# Patient Record
Sex: Male | Born: 1980 | Race: White | Hispanic: No | Marital: Married | Smoking: Never smoker
Health system: Southern US, Community
[De-identification: ages and names within clinical notes are randomized; demographics above are authoritative.]

## PROBLEM LIST (undated history)

## (undated) DIAGNOSIS — Z87442 Personal history of urinary calculi: Secondary | ICD-10-CM

## (undated) DIAGNOSIS — N471 Phimosis: Secondary | ICD-10-CM

## (undated) DIAGNOSIS — Z973 Presence of spectacles and contact lenses: Secondary | ICD-10-CM

## (undated) HISTORY — PX: WISDOM TOOTH EXTRACTION: SHX21

---

## 2016-06-28 ENCOUNTER — Emergency Department: Payer: Managed Care, Other (non HMO)

## 2016-06-28 ENCOUNTER — Emergency Department
Admission: EM | Admit: 2016-06-28 | Discharge: 2016-06-28 | Disposition: A | Discharge status: Home / Self Care | Payer: Managed Care, Other (non HMO) | Attending: Emergency Medicine | Admitting: Emergency Medicine

## 2016-06-28 DIAGNOSIS — R109 Unspecified abdominal pain: Secondary | ICD-10-CM | POA: Diagnosis present

## 2016-06-28 DIAGNOSIS — N200 Calculus of kidney: Secondary | ICD-10-CM | POA: Insufficient documentation

## 2016-06-28 LAB — CBC WITH DIFFERENTIAL/PLATELET
BASOS ABS: 0.1 10*3/uL (ref 0–0.1)
Basophils Relative: 1 %
EOS PCT: 2 %
Eosinophils Absolute: 0.2 10*3/uL (ref 0–0.7)
HCT: 43.7 % (ref 40.0–52.0)
Hemoglobin: 14.9 g/dL (ref 13.0–18.0)
LYMPHS ABS: 3.1 10*3/uL (ref 1.0–3.6)
LYMPHS PCT: 38 %
MCH: 30.9 pg (ref 26.0–34.0)
MCHC: 34.2 g/dL (ref 32.0–36.0)
MCV: 90.3 fL (ref 80.0–100.0)
Monocytes Absolute: 0.8 10*3/uL (ref 0.2–1.0)
Monocytes Relative: 10 %
NEUTROS PCT: 49 %
Neutro Abs: 4 10*3/uL (ref 1.4–6.5)
PLATELETS: 234 10*3/uL (ref 150–440)
RBC: 4.83 MIL/uL (ref 4.40–5.90)
RDW: 12.7 % (ref 11.5–14.5)
WBC: 8.2 10*3/uL (ref 3.8–10.6)

## 2016-06-28 LAB — COMPREHENSIVE METABOLIC PANEL
ALT: 24 U/L (ref 17–63)
AST: 23 U/L (ref 15–41)
Albumin: 4.5 g/dL (ref 3.5–5.0)
Alkaline Phosphatase: 83 U/L (ref 38–126)
Anion gap: 6 (ref 5–15)
BUN: 15 mg/dL (ref 6–20)
CHLORIDE: 108 mmol/L (ref 101–111)
CO2: 26 mmol/L (ref 22–32)
Calcium: 9.1 mg/dL (ref 8.9–10.3)
Creatinine, Ser: 1.4 mg/dL — ABNORMAL HIGH (ref 0.61–1.24)
GFR calc Af Amer: >60 mL/min (ref >60)
Glucose, Bld: 101 mg/dL — ABNORMAL HIGH (ref 65–99)
POTASSIUM: 3.5 mmol/L (ref 3.5–5.1)
Sodium: 140 mmol/L (ref 135–145)
Total Bilirubin: 1 mg/dL (ref 0.3–1.2)
Total Protein: 7.4 g/dL (ref 6.5–8.1)

## 2016-06-28 LAB — URINALYSIS, COMPLETE (UACMP) WITH MICROSCOPIC
BILIRUBIN URINE: NEGATIVE
Bacteria, UA: NONE SEEN
GLUCOSE, UA: NEGATIVE mg/dL
Hgb urine dipstick: NEGATIVE
KETONES UR: NEGATIVE mg/dL
Leukocytes, UA: NEGATIVE
NITRITE: NEGATIVE
PH: 5 (ref 5.0–8.0)
Protein, ur: NEGATIVE mg/dL
RBC / HPF: NONE SEEN RBC/hpf (ref 0–5)
Specific Gravity, Urine: 1.024 (ref 1.005–1.030)
Squamous Epithelial / LPF: NONE SEEN

## 2016-06-28 LAB — LIPASE, BLOOD: LIPASE: 24 U/L (ref 11–51)

## 2016-06-28 MED ORDER — TAMSULOSIN HCL 0.4 MG PO CAPS: 0.4000 mg | ORAL_CAPSULE | Freq: Every day | ORAL | 0 refills | Status: DC

## 2016-06-28 MED ORDER — OXYCODONE-ACETAMINOPHEN 5-325 MG PO TABS: 1.0000 | ORAL_TABLET | ORAL | 0 refills | Status: DC | PRN

## 2016-06-28 MED ORDER — SODIUM CHLORIDE 0.9 % IV BOLUS (SEPSIS)
1000.0000 mL | Freq: Once | INTRAVENOUS | Status: AC
  Administered 2016-06-28: 1000 mL via INTRAVENOUS

## 2016-06-28 MED ORDER — ONDANSETRON HCL 4 MG/2ML IJ SOLN
4.0000 mg | Freq: Once | INTRAMUSCULAR | Status: AC
  Administered 2016-06-28: 4 mg via INTRAVENOUS
  Filled 2016-06-28: qty 2

## 2016-06-28 MED ORDER — ONDANSETRON 4 MG PO TBDP: 4.0000 mg | ORAL_TABLET | Freq: Three times a day (TID) | ORAL | 0 refills | Status: DC | PRN

## 2016-06-28 MED ORDER — MORPHINE SULFATE (PF) 4 MG/ML IV SOLN
4.0000 mg | Freq: Once | INTRAVENOUS | Status: AC
  Administered 2016-06-28: 4 mg via INTRAVENOUS
  Filled 2016-06-28: qty 1

## 2016-06-28 MED ORDER — MORPHINE SULFATE (PF) 4 MG/ML IV SOLN
INTRAVENOUS | Status: AC
  Administered 2016-06-28: 4 mg
  Filled 2016-06-28: qty 1

## 2016-06-28 NOTE — ED Provider Notes (Signed)
[facility] Emergency Department Provider Note    First MD Initiated Contact with Patient 06/28/16 [phone]     (approximate)  I have reviewed the triage vital signs and the nursing notes.   HISTORY  Chief Complaint Flank Pain   HPI Shaun Holder is a 36 y.o. male presents emergency department acute onset of right flank pain is currently 8 out of 10 but at maximum intensity was 10 out of 10. Patient states symptoms will come from sleep this morning. Patient denies any fever. Patient does admit to nausea no vomiting. Patient does admit to previous history of kidney stones is consistent with this current episode. Patient denies any dyspnea or hematuria   Past medical history Kidney stones There are no active problems to display for this patient.   Past surgical history None  Prior to Admission medications   Medication Sig Start Date End Date Taking? Authorizing Provider  ondansetron (ZOFRAN ODT) 4 MG disintegrating tablet Take 1 tablet (4 mg total) by mouth every 8 (eight) hours as needed for nausea or vomiting. 06/28/16   Darci Current, MD  oxyCODONE-acetaminophen (ROXICET) 5-325 MG tablet Take 1 tablet by mouth every 4 (four) hours as needed for severe pain. 06/28/16   Darci Current, MD  tamsulosin (FLOMAX) 0.4 MG CAPS capsule Take 1 capsule (0.4 mg total) by mouth daily after breakfast. 06/28/16   Darci Current, MD    Allergies No known drug allergies No family history on file.  Social History Social History  Substance Use Topics  . Smoking status: Not on file  . Smokeless tobacco: Not on file  . Alcohol use Not on file    Review of Systems Constitutional: No fever/chills Eyes: No visual changes. ENT: No sore throat. Cardiovascular: Denies chest pain. Respiratory: Denies shortness of breath. Gastrointestinal: Positive for right flank pain  positive for nausea, no vomiting.  No diarrhea.  No constipation. Genitourinary: Negative for  dysuria. Musculoskeletal: Negative for back pain. Skin: Negative for rash. Neurological: Negative for headaches, focal weakness or numbness.  10-point ROS otherwise negative.  ____________________________________________   PHYSICAL EXAM:  VITAL SIGNS: ED Triage Vitals  Enc Vitals Group     BP 06/28/16 0435 (!) 150/91     Pulse Rate 06/28/16 0435 66     Resp 06/28/16 0435 18     Temp 06/28/16 0435 98.8 F (37.1 C)     Temp Source 06/28/16 0435 Oral     SpO2 06/28/16 0435 97 %     Weight 06/28/16 0436 223 lb (101.2 kg)     Height 06/28/16 0436  (1.778 m)     Head Circumference --      Peak Flow --      Pain Score 06/28/16 0435 8     Pain Loc --      Pain Edu? --      Excl. in GC? --     Constitutional: Alert and oriented. Apparent discomfort Eyes: Conjunctivae are normal. PERRL. EOMI. Head: Atraumatic. Mouth/Throat: Mucous membranes are moist. Oropharynx non-erythematous. Neck: No stridor.   Cardiovascular: Normal rate, regular rhythm. Good peripheral circulation. Grossly normal heart sounds. Respiratory: Normal respiratory effort.  No retractions. Lungs CTAB. Gastrointestinal: Soft and nontender. No distention.  Musculoskeletal: No lower extremity tenderness nor edema. No gross deformities of extremities. Neurologic:  Normal speech and language. No gross focal neurologic deficits are appreciated.  Skin:  Skin is warm, dry and intact. No rash noted.   ____________________________________________  LABS (all labs ordered are listed, but only abnormal results are displayed)  Labs Reviewed  COMPREHENSIVE METABOLIC PANEL - Abnormal; Notable for the following:       Result Value   Glucose, Bld 101 (*)    Creatinine, Ser 1.40 (*)    All other components within normal limits  URINALYSIS, COMPLETE (UACMP) WITH MICROSCOPIC - Abnormal; Notable for the following:    Color, Urine YELLOW (*)    APPearance CLEAR (*)    All other components within normal limits  CBC  WITH DIFFERENTIAL/PLATELET  LIPASE, BLOOD    RADIOLOGY I, [place] N Jameson Tormey, personally viewed and evaluated these images (plain radiographs) as part of my medical decision making, as well as reviewing the written report by the radiologist.  Ct Renal Stone Study  Result Date: 06/28/2016 CLINICAL DATA:  Awoke with flank pain. EXAM: CT ABDOMEN AND PELVIS WITHOUT CONTRAST TECHNIQUE: Multidetector CT imaging of the abdomen and pelvis was performed following the standard protocol without IV contrast. COMPARISON:  None. FINDINGS: Lower chest: Dependent atelectasis at the right lung base. No pleural fluid. Hepatobiliary: No focal liver abnormality is seen. No gallstones, gallbladder wall thickening, or biliary dilatation. Pancreas: No ductal dilatation or inflammation. Spleen: Normal in size without focal abnormality. Adrenals/Urinary Tract: No adrenal nodule. Obstructing 3 mm stone in the distal right ureter just proximal to the ureterovesicular junction with moderate proximal hydroureteronephrosis. Mild right perinephric edema. There are at least 5 nonobstructing stones in the right kidney. No left hydronephrosis. There are least 4 nonobstructing stones in the left kidney. Urinary bladder is near completely decompressed without stone. Stomach/Bowel: Stomach is within normal limits. Appendix appears normal. No evidence of bowel wall thickening, distention, or inflammatory changes. Vascular/Lymphatic: No significant vascular findings are present. No enlarged abdominal or pelvic lymph nodes. Reproductive: Prostate is unremarkable. Other: No free air, free fluid, or intra-abdominal fluid collection. Tiny fat containing umbilical hernia. Musculoskeletal: There are no acute or suspicious osseous abnormalities. IMPRESSION: 1. Obstructing 3 mm stone in the distal right ureter just proximal to the ureterovesicular junction with moderate hydroureteronephrosis. 2. Multiple bilateral nonobstructing renal calculi.  Electronically Signed   By: Rubye Oaks M.D.   On: 06/28/2016 05:55      Procedures   ____________________________________________   INITIAL IMPRESSION / ASSESSMENT AND PLAN / ED COURSE  Pertinent labs & imaging results that were available during my care of the patient were reviewed by me and considered in my medical decision making (see chart for details).  She given IV morphine and Zofran analgesia and antiemetic respectively with resolution of both. CT scan revealed right ureterolithiasis. Patient prescribe Percocet Flomax and Zofran for home. Patient pain at this time is 0      ____________________________________________  FINAL CLINICAL IMPRESSION(S) / ED DIAGNOSES  Final diagnoses:  Kidney stone     MEDICATIONS GIVEN DURING THIS VISIT:  Medications  morphine 4 MG/ML injection 4 mg (4 mg Intravenous Given 06/28/16 0457)  ondansetron (ZOFRAN) injection 4 mg (4 mg Intravenous Given 06/28/16 0456)  sodium chloride 0.9 % bolus 1,000 mL (0 mLs Intravenous Stopped 06/28/16 0542)  morphine 4 MG/ML injection (4 mg  Given 06/28/16 0545)     NEW OUTPATIENT MEDICATIONS STARTED DURING THIS VISIT:  Discharge Medication List as of 06/28/2016  6:45 AM    START taking these medications   Details  ondansetron (ZOFRAN ODT) 4 MG disintegrating tablet Take 1 tablet (4 mg total) by mouth every 8 (eight) hours as needed for nausea or vomiting., Starting  Fri 06/28/2016, Print    oxyCODONE-acetaminophen (ROXICET) 5-325 MG tablet Take 1 tablet by mouth every 4 (four) hours as needed for severe pain., Starting Fri 06/28/2016, Print    tamsulosin (FLOMAX) 0.4 MG CAPS capsule Take 1 capsule (0.4 mg total) by mouth daily after breakfast., Starting Fri 06/28/2016, Print        Discharge Medication List as of 06/28/2016  6:45 AM      Discharge Medication List as of 06/28/2016  6:45 AM       Note:  This document was prepared using Dragon voice recognition software and may include  unintentional dictation errors.    Darci Current, MD 06/28/16 [phone]

## 2016-06-28 NOTE — ED Triage Notes (Signed)
Pt woke up tonight with left flank pain, hx of kidney stones states feels the same.

## 2016-06-28 NOTE — ED Notes (Signed)
ED Provider at bedside. 

## 2016-06-29 ENCOUNTER — Emergency Department
Admission: EM | Admit: 2016-06-29 | Discharge: 2016-06-29 | Disposition: A | Discharge status: Home / Self Care | Payer: Managed Care, Other (non HMO) | Attending: Emergency Medicine | Admitting: Emergency Medicine

## 2016-06-29 ENCOUNTER — Encounter: Payer: Self-pay | Admitting: Emergency Medicine

## 2016-06-29 DIAGNOSIS — Z79899 Other long term (current) drug therapy: Secondary | ICD-10-CM | POA: Insufficient documentation

## 2016-06-29 DIAGNOSIS — N201 Calculus of ureter: Secondary | ICD-10-CM | POA: Insufficient documentation

## 2016-06-29 DIAGNOSIS — R109 Unspecified abdominal pain: Secondary | ICD-10-CM | POA: Diagnosis present

## 2016-06-29 LAB — URINALYSIS, COMPLETE (UACMP) WITH MICROSCOPIC
BACTERIA UA: NONE SEEN
Bilirubin Urine: NEGATIVE
GLUCOSE, UA: NEGATIVE mg/dL
HGB URINE DIPSTICK: NEGATIVE
Ketones, ur: NEGATIVE mg/dL
Leukocytes, UA: NEGATIVE
NITRITE: NEGATIVE
PH: 5 (ref 5.0–8.0)
Protein, ur: NEGATIVE mg/dL
SPECIFIC GRAVITY, URINE: 1.028 (ref 1.005–1.030)
Squamous Epithelial / LPF: NONE SEEN

## 2016-06-29 MED ORDER — KETOROLAC TROMETHAMINE 30 MG/ML IJ SOLN
15.0000 mg | Freq: Once | INTRAMUSCULAR | Status: AC
  Administered 2016-06-29: 15 mg via INTRAVENOUS
  Filled 2016-06-29: qty 1

## 2016-06-29 MED ORDER — SODIUM CHLORIDE 0.9 % IV BOLUS (SEPSIS)
1000.0000 mL | Freq: Once | INTRAVENOUS | Status: AC
  Administered 2016-06-29: 1000 mL via INTRAVENOUS

## 2016-06-29 MED ORDER — TAMSULOSIN HCL 0.4 MG PO CAPS
0.4000 mg | ORAL_CAPSULE | Freq: Once | ORAL | Status: AC
  Administered 2016-06-29: 0.4 mg via ORAL
  Filled 2016-06-29: qty 1

## 2016-06-29 MED ORDER — IBUPROFEN 800 MG PO TABS: 800.0000 mg | ORAL_TABLET | Freq: Three times a day (TID) | ORAL | 0 refills | Status: DC | PRN

## 2016-06-29 MED ORDER — MORPHINE SULFATE (PF) 4 MG/ML IV SOLN
4.0000 mg | Freq: Once | INTRAVENOUS | Status: AC
  Administered 2016-06-29: 4 mg via INTRAVENOUS
  Filled 2016-06-29: qty 1

## 2016-06-29 NOTE — ED Triage Notes (Signed)
Pt reports seen here yesterday and diagnosed with kidney stone. Pt reports pain today is worse.

## 2016-06-29 NOTE — Discharge Instructions (Signed)
You have been seen in the Emergency Department (ED)  Today and was diagnosed with kidney stones. While the stone is traveling through the ureter, which is the tube that carries urine from the kidney to the bladder, you will probably feel pain. The pain may be mild or very severe. You may also have some blood in your urine. As soon as the stone reaches the bladder, any intense pain should go away. If a stone is too large to pass on its own, you may need a medical procedure to help you pass the stone.  ° °As we have discussed, please drink plenty of fluids and use a urinary strainer to attempt to capture the stone.  Please make a follow up appointment with Urology in the next week by calling the number below and bring the stone with you. ° °Take ibuprofen 800mg every 6 hours for the pain. If the pain is not well controlled with ibuprofen you may take one percocet every 4 hours. Do not take tylenol while taking percocet. Please also take your prescribed flomax daily. Check with your doctor if you have a history of gastritis, stomach ulcers, renal failure or impaired kidney function as you may not be able to take ibuprofen/ motrin. Your doctor can give you a different prescription for pain control. ° °Follow-up with your doctor or return to the ER in 12-24 hours if your pain is not well controlled, if you develop pain or burning with urination, or if you develop a fever. Otherwise follow up in 3-5 days with your doctor. ° °When should you call for help?  °Call your doctor now or seek immediate medical care if:  °You cannot keep down fluids.  °Your pain gets worse.  °You have a fever or chills.  °You have new or worse pain in your back just below your rib cage (the flank area).  °You have new or more blood in your urine. °You have pain or burning with urination °You are unable to urinate °You have abdominal pain ° °Watch closely for changes in your health, and be sure to contact your doctor if:  °You do not get better as  expected ° °How can you care for yourself at home?  °Drink plenty of fluids, enough so that your urine is light yellow or clear like water. If you have kidney, heart, or liver disease and have to limit fluids, talk with your doctor before you increase the amount of fluids you drink.  °Take pain medicines exactly as directed. Call your doctor if you think you are having a problem with your medicine.  °If the doctor gave you a prescription medicine for pain, take it as prescribed.  °If you are not taking a prescription pain medicine, ask your doctor if you can take an over-the-counter medicine. Read and follow all instructions on the label. °Your doctor may ask you to strain your urine so that you can collect your kidney stone when it passes. You can use a kitchen strainer or a tea strainer to catch the stone. Store it in a plastic bag until you see your doctor again. ° °Preventing future kidney stones  °Some changes in your diet may help prevent kidney stones. Depending on the cause of your stones, your doctor may recommend that you:  °Drink plenty of fluids, enough so that your urine is light yellow or clear like water. If you have kidney, heart, or liver disease and have to limit fluids, talk with your doctor before you increase   the amount of fluids you drink.  °Limit coffee, tea, and alcohol. Also avoid grapefruit juice.  °Do not take more than the recommended daily dose of vitamins C and D.  °Avoid antacids such as Gaviscon, Maalox, Mylanta, or Tums.  °Limit the amount of salt (sodium) in your diet.  °Eat a balanced diet that is not too high in protein.  °Limit foods that are high in a substance called oxalate, which can cause kidney stones. These foods include dark green vegetables, rhubarb, chocolate, wheat bran, nuts, cranberries, and beans. ° ° ° °

## 2016-06-29 NOTE — ED Provider Notes (Signed)
[facility] Emergency Department Provider Note  ____________________________________________  Time seen: Approximately 12:31 PM  I have reviewed the triage vital signs and the nursing notes.   HISTORY  Chief Complaint Flank Pain and Nephrolithiasis   HPI Shaun Holder is a 36 y.o. male a history of kidney stones recently diagnosed with one yesterdaywho presents for worsening pain. Patient endorses 9 out of 10 right flank pain radiating to his right groin, crampy, intermittent, associated with nausea. He took one Zofran and 1 Percocet this morning with no improvement of the pain. No fever or chills, no dysuria, no abdominal pain, no vomiting.  History reviewed. No pertinent past medical history.  There are no active problems to display for this patient.   No past surgical history on file.  Prior to Admission medications   Medication Sig Start Date End Date Taking? Authorizing Provider  ondansetron (ZOFRAN ODT) 4 MG disintegrating tablet Take 1 tablet (4 mg total) by mouth every 8 (eight) hours as needed for nausea or vomiting. 06/28/16  Yes Darci Current, MD  oxyCODONE-acetaminophen (ROXICET) 5-325 MG tablet Take 1 tablet by mouth every 4 (four) hours as needed for severe pain. 06/28/16  Yes Darci Current, MD  tamsulosin (FLOMAX) 0.4 MG CAPS capsule Take 1 capsule (0.4 mg total) by mouth daily after breakfast. 06/28/16  Yes Darci Current, MD  ibuprofen (ADVIL,MOTRIN) 800 MG tablet Take 1 tablet (800 mg total) by mouth every 8 (eight) hours as needed. 06/29/16   Nita Sickle, MD    Allergies Patient has no known allergies.  No family history on file.  Social History Social History  Substance Use Topics  . Smoking status: Not on file  . Smokeless tobacco: Not on file  . Alcohol use Not on file    Review of Systems  Constitutional: Negative for fever. Eyes: Negative for visual changes. ENT: Negative for sore throat. Neck: No neck  pain  Cardiovascular: Negative for chest pain. Respiratory: Negative for shortness of breath. Gastrointestinal: Negative for abdominal pain, vomiting or diarrhea. + nausea Genitourinary: Negative for dysuria. + R flank pain Musculoskeletal: Negative for back pain. Skin: Negative for rash. Neurological: Negative for headaches, weakness or numbness. Psych: No SI or HI  ____________________________________________   PHYSICAL EXAM:  VITAL SIGNS: ED Triage Vitals  Enc Vitals Group     BP 06/29/16 1215 137/89     Pulse Rate 06/29/16 1215 100     Resp 06/29/16 1215 20     Temp 06/29/16 1215 97.8 F (36.6 C)     Temp Source 06/29/16 1215 Oral     SpO2 06/29/16 1215 98 %     Weight 06/29/16 1216 223 lb (101.2 kg)     Height 06/29/16 1216  (1.778 m)     Head Circumference --      Peak Flow --      Pain Score 06/29/16 1215 9     Pain Loc --      Pain Edu? --      Excl. in GC? --     Constitutional: Alert and oriented. Well appearing and in no apparent distress. HEENT:      Head: Normocephalic and atraumatic.         Eyes: Conjunctivae are normal. Sclera is non-icteric. EOMI. PERRL      Mouth/Throat: Mucous membranes are moist.       Neck: Supple with no signs of meningismus. Cardiovascular: Regular rate and rhythm. No murmurs, gallops, or rubs.  2+ symmetrical distal pulses are present in all extremities. No JVD. Respiratory: Normal respiratory effort. Lungs are clear to auscultation bilaterally. No wheezes, crackles, or rhonchi.  Gastrointestinal: Soft, non tender, and non distended with positive bowel sounds. No rebound or guarding. Genitourinary: R CVA tenderness. Musculoskeletal: Nontender with normal range of motion in all extremities. No edema, cyanosis, or erythema of extremities. Neurologic: Normal speech and language. Face is symmetric. Moving all extremities. No gross focal neurologic deficits are appreciated. Skin: Skin is warm, dry and intact. No rash  noted. Psychiatric: Mood and affect are normal. Speech and behavior are normal.  ____________________________________________   LABS (all labs ordered are listed, but only abnormal results are displayed)  Labs Reviewed  URINALYSIS, COMPLETE (UACMP) WITH MICROSCOPIC - Abnormal; Notable for the following:       Result Value   Color, Urine YELLOW (*)    APPearance HAZY (*)    All other components within normal limits   ____________________________________________  EKG  none ____________________________________________  RADIOLOGY  none  ____________________________________________   PROCEDURES  Procedure(s) performed: None Procedures Critical Care performed:  None ____________________________________________   INITIAL IMPRESSION / ASSESSMENT AND PLAN / ED COURSE   36 y.o. male a history of kidney stones recently diagnosed with one yesterdaywho presents for worsening pain. Will check UA to rule out superimposed infection. Will treat with IV toradol, IV morphine, fluid and flomax.  Clinical Course as of Jun 29 1433  Sat Jun 29, 2016  1433 Patient reports pain is markedly improved at this time. UA with no evidence of urinary tract infection. We'll discharge home on standing ibuprofen, Percocet when necessary and Flomax.  [CV]    Clinical Course User Index [CV] Nita Sickle, MD    Pertinent labs & imaging results that were available during my care of the patient were reviewed by me and considered in my medical decision making (see chart for details).    ____________________________________________   FINAL CLINICAL IMPRESSION(S) / ED DIAGNOSES  Final diagnoses:  Ureterolithiasis      NEW MEDICATIONS STARTED DURING THIS VISIT:  New Prescriptions   IBUPROFEN (ADVIL,MOTRIN) 800 MG TABLET    Take 1 tablet (800 mg total) by mouth every 8 (eight) hours as needed.     Note:  This document was prepared using Dragon voice recognition software and may include  unintentional dictation errors.    Nita Sickle, MD 06/29/16 1435

## 2016-08-24 ENCOUNTER — Observation Stay
Admission: EM | Admit: 2016-08-24 | Discharge: 2016-08-25 | Disposition: A | Discharge status: Home / Self Care | Payer: Managed Care, Other (non HMO) | Attending: Internal Medicine | Admitting: Internal Medicine

## 2016-08-24 ENCOUNTER — Emergency Department: Payer: Managed Care, Other (non HMO)

## 2016-08-24 ENCOUNTER — Encounter: Payer: Self-pay | Admitting: Emergency Medicine

## 2016-08-24 DIAGNOSIS — N200 Calculus of kidney: Secondary | ICD-10-CM | POA: Insufficient documentation

## 2016-08-24 DIAGNOSIS — R109 Unspecified abdominal pain: Secondary | ICD-10-CM | POA: Diagnosis present

## 2016-08-24 DIAGNOSIS — E860 Dehydration: Secondary | ICD-10-CM | POA: Insufficient documentation

## 2016-08-24 DIAGNOSIS — N179 Acute kidney failure, unspecified: Secondary | ICD-10-CM | POA: Diagnosis not present

## 2016-08-24 LAB — URINALYSIS, COMPLETE (UACMP) WITH MICROSCOPIC
BILIRUBIN URINE: NEGATIVE
Bacteria, UA: NONE SEEN
Glucose, UA: NEGATIVE mg/dL
Hgb urine dipstick: NEGATIVE
Ketones, ur: NEGATIVE mg/dL
LEUKOCYTES UA: NEGATIVE
Nitrite: NEGATIVE
PROTEIN: NEGATIVE mg/dL
SPECIFIC GRAVITY, URINE: 1.013 (ref 1.005–1.030)
SQUAMOUS EPITHELIAL / LPF: NONE SEEN
pH: 5 (ref 5.0–8.0)

## 2016-08-24 LAB — CBC
HCT: 39.7 % — ABNORMAL LOW (ref 40.0–52.0)
Hemoglobin: 14.1 g/dL (ref 13.0–18.0)
MCH: 31 pg (ref 26.0–34.0)
MCHC: 35.5 g/dL (ref 32.0–36.0)
MCV: 87.3 fL (ref 80.0–100.0)
PLATELETS: 216 10*3/uL (ref 150–440)
RBC: 4.54 MIL/uL (ref 4.40–5.90)
RDW: 13.1 % (ref 11.5–14.5)
WBC: 10.5 10*3/uL (ref 3.8–10.6)

## 2016-08-24 LAB — BASIC METABOLIC PANEL
Anion gap: 8 (ref 5–15)
BUN: 23 mg/dL — ABNORMAL HIGH (ref 6–20)
CHLORIDE: 103 mmol/L (ref 101–111)
CO2: 28 mmol/L (ref 22–32)
CREATININE: 2.41 mg/dL — AB (ref 0.61–1.24)
Calcium: 9.2 mg/dL (ref 8.9–10.3)
GFR, EST AFRICAN AMERICAN: 39 mL/min — AB (ref >60)
GFR, EST NON AFRICAN AMERICAN: 33 mL/min — AB (ref >60)
Glucose, Bld: 93 mg/dL (ref 65–99)
Potassium: 4.7 mmol/L (ref 3.5–5.1)
SODIUM: 139 mmol/L (ref 135–145)

## 2016-08-24 LAB — HEPATIC FUNCTION PANEL
ALBUMIN: 4.3 g/dL (ref 3.5–5.0)
ALT: 22 U/L (ref 17–63)
AST: 27 U/L (ref 15–41)
Alkaline Phosphatase: 71 U/L (ref 38–126)
BILIRUBIN DIRECT: 0.1 mg/dL (ref 0.1–0.5)
Indirect Bilirubin: 0.9 mg/dL (ref 0.3–0.9)
TOTAL PROTEIN: 7.4 g/dL (ref 6.5–8.1)
Total Bilirubin: 1 mg/dL (ref 0.3–1.2)

## 2016-08-24 LAB — LIPASE, BLOOD: Lipase: 24 U/L (ref 11–51)

## 2016-08-24 MED ORDER — TAMSULOSIN HCL 0.4 MG PO CAPS
0.4000 mg | ORAL_CAPSULE | Freq: Every day | ORAL | Status: DC
  Filled 2016-08-24: qty 1

## 2016-08-24 MED ORDER — OXYCODONE-ACETAMINOPHEN 5-325 MG PO TABS
1.0000 | ORAL_TABLET | ORAL | Status: DC | PRN
  Administered 2016-08-24: 1 via ORAL
  Filled 2016-08-24: qty 1

## 2016-08-24 MED ORDER — ACETAMINOPHEN 325 MG PO TABS: 650.0000 mg | ORAL_TABLET | Freq: Four times a day (QID) | ORAL | Status: DC | PRN

## 2016-08-24 MED ORDER — ONDANSETRON HCL 4 MG/2ML IJ SOLN
4.0000 mg | Freq: Once | INTRAMUSCULAR | Status: AC
  Administered 2016-08-24: 4 mg via INTRAVENOUS
  Filled 2016-08-24: qty 2

## 2016-08-24 MED ORDER — FENTANYL CITRATE (PF) 100 MCG/2ML IJ SOLN
25.0000 ug | Freq: Once | INTRAMUSCULAR | Status: AC
  Administered 2016-08-24: 25 ug via INTRAVENOUS
  Filled 2016-08-24: qty 2

## 2016-08-24 MED ORDER — ONDANSETRON HCL 4 MG PO TABS: 4.0000 mg | ORAL_TABLET | Freq: Four times a day (QID) | ORAL | Status: DC | PRN

## 2016-08-24 MED ORDER — FENTANYL CITRATE (PF) 100 MCG/2ML IJ SOLN
50.0000 ug | Freq: Once | INTRAMUSCULAR | Status: AC
  Administered 2016-08-24: 50 ug via INTRAVENOUS
  Filled 2016-08-24: qty 2

## 2016-08-24 MED ORDER — ONDANSETRON HCL 4 MG/2ML IJ SOLN: 4.0000 mg | Freq: Four times a day (QID) | INTRAMUSCULAR | Status: DC | PRN

## 2016-08-24 MED ORDER — SODIUM CHLORIDE 0.9 % IV BOLUS (SEPSIS)
1500.0000 mL | Freq: Once | INTRAVENOUS | Status: AC
  Administered 2016-08-24: 1500 mL via INTRAVENOUS

## 2016-08-24 MED ORDER — SODIUM CHLORIDE 0.9 % IV SOLN
INTRAVENOUS | Status: DC
Start: 2016-08-24 — End: 2016-08-25
  Administered 2016-08-24 – 2016-08-25 (×3): via INTRAVENOUS

## 2016-08-24 MED ORDER — ACETAMINOPHEN 650 MG RE SUPP: 650.0000 mg | Freq: Four times a day (QID) | RECTAL | Status: DC | PRN

## 2016-08-24 NOTE — ED Notes (Signed)
Urine was strained with no presence of a stone.

## 2016-08-24 NOTE — Progress Notes (Signed)
Spoke with Dr. Fanny BienQuale regarding patient.  He has a small stone in the left ureter right at the ureteral orifice (UVJ), and likely will pass this stone quickly.  His pain is well controlled.  He has no evidence of infection.  However, his renal function has declined acutely.  He has had symptoms of stone pain intermittently for the last 10 days and using Ibuprofen to treat it.  I suspect that he has acute renal failure from dehydration and NSAID use resulting in ATN.  This will improve with time and hydration.  The patient should continue with medical explusion therapy. I will arrange Urology follow-up next week for a recheck of his creatinine and management of his nephrolithiasis.

## 2016-08-24 NOTE — ED Triage Notes (Addendum)
Pt has hx of kidney stones states the pain started a week ago, but worse yesterday and this morning. Took Tylenol this morning and flomax.  Continues to have pain and nausea without vomiting. Tick bite also last Saturday

## 2016-08-24 NOTE — ED Provider Notes (Signed)
[facility] Emergency Department Provider Note  ____________________________________________   First MD Initiated Contact with Patient 08/24/16 1252     (approximate)  I have reviewed the triage vital signs and the nursing notes.   HISTORY  Chief Complaint Flank Pain   HPI Shaun Holder is a 36 y.o. male here for evaluation of left flank pain nausea and decreased appetite  Patient reports he certainly has a kidney stone, he said multiple the past. He said ongoing pain, about one week now in the left flank. Since been steady and has not moved. He reports nausea but no vomiting. Decreased oral intake. No fevers or chills. He's had increased frequent urination and feels a strange burning urination  Chest pain or trouble breathing. He has been taking ibuprofen about 800 mg twice a day for the last week  Reports a sharp pain in the left flank.  History reviewed. No pertinent past medical history.  There are no active problems to display for this patient.   History reviewed. No pertinent surgical history.  Prior to Admission medications   Medication Sig Start Date End Date Taking? Authorizing Provider  ibuprofen (ADVIL,MOTRIN) 200 MG tablet Take 200 mg by mouth every 6 (six) hours as needed.   Yes [provider]  ondansetron (ZOFRAN ODT) 4 MG disintegrating tablet Take 1 tablet (4 mg total) by mouth every 8 (eight) hours as needed for nausea or vomiting. 06/28/16  Yes Darci Current, MD  oxyCODONE-acetaminophen (ROXICET) 5-325 MG tablet Take 1 tablet by mouth every 4 (four) hours as needed for severe pain. 06/28/16  Yes Darci Current, MD  tamsulosin [facility]) 0.4 MG CAPS capsule Take 1 capsule (0.4 mg total) by mouth daily after breakfast. 06/28/16  Yes Darci Current, MD  ibuprofen (ADVIL,MOTRIN) 800 MG tablet Take 1 tablet (800 mg total) by mouth every 8 (eight) hours as needed. Patient not taking: Reported on 08/24/2016 06/29/16    Nita Sickle, MD    Allergies Patient has no known allergies.  History reviewed. No pertinent family history.  Social History Social History  Substance Use Topics  . Smoking status: Never Smoker  . Smokeless tobacco: Never Used  . Alcohol use No    Review of Systems Constitutional: No fever/chills Eyes: No visual changes. ENT: No sore throat. Cardiovascular: Denies chest pain. Respiratory: Denies shortness of breath. Gastrointestinal: No diarrhea.  No constipation. Genitourinary: Negative for dysuria. Musculoskeletal: Negative for back pain. Skin: Negative for rash. Neurological: Negative for headaches. 10-point ROS otherwise negative.  ____________________________________________   PHYSICAL EXAM:  VITAL SIGNS: ED Triage Vitals  Enc Vitals Group     BP 08/24/16 1235 (!) 141/95     Pulse Rate 08/24/16 1235 98     Resp 08/24/16 1235 18     Temp 08/24/16 1239 97.7 F (36.5 C)     Temp Source 08/24/16 1239 Oral     SpO2 08/24/16 1235 100 %     Weight 08/24/16 1235 230 lb (104.3 kg)     Height 08/24/16 1235 5\' 10"  (1.778 m)     Head Circumference --      Peak Flow --      Pain Score 08/24/16 1234 7     Pain Loc --      Pain Edu? --      Excl. in GC? --     Constitutional: Alert and oriented. Well appearing and in no acute distress.Patient reports after receiving pain medicine prior, all his pain is  now gone. Eyes: Conjunctivae are normal. PERRL. EOMI. Head: Atraumatic. Nose: No congestion/rhinnorhea. Mouth/Throat: Mucous membranes are moist.  Oropharynx non-erythematous. Neck: No stridor.   Cardiovascular: Normal rate, regular rhythm. Grossly normal heart sounds.  Good peripheral circulation. Respiratory: Normal respiratory effort.  No retractions. Lungs CTAB. Gastrointestinal: Soft and nontender. No distention.  Musculoskeletal: No lower extremity tenderness nor edema.  No joint effusions. Neurologic:  Normal speech and language. No gross focal  neurologic deficits are appreciated.  Skin:  Skin is warm, dry and intact. No rash noted. Psychiatric: Mood and affect are normal. Speech and behavior are normal.  ____________________________________________   LABS (all labs ordered are listed, but only abnormal results are displayed)  Labs Reviewed  URINALYSIS, COMPLETE (UACMP) WITH MICROSCOPIC - Abnormal; Notable for the following:       Result Value   Color, Urine YELLOW (*)    APPearance CLEAR (*)    All other components within normal limits  BASIC METABOLIC PANEL - Abnormal; Notable for the following:    BUN 23 (*)    Creatinine, Ser 2.41 (*)    GFR calc non Af Amer 33 (*)    GFR calc Af Amer 39 (*)    All other components within normal limits  CBC - Abnormal; Notable for the following:    HCT 39.7 (*)    All other components within normal limits  HEPATIC FUNCTION PANEL  LIPASE, BLOOD   ____________________________________________  EKG   ____________________________________________  RADIOLOGY  Ct Renal Stone Study  Result Date: 08/24/2016 CLINICAL DATA:  Left flank pain for 1 week. EXAM: CT ABDOMEN AND PELVIS WITHOUT CONTRAST TECHNIQUE: Multidetector CT imaging of the abdomen and pelvis was performed following the standard protocol without IV contrast. COMPARISON:  None. FINDINGS: Lower chest: No acute abnormality. Hepatobiliary: No focal liver abnormality is seen. No gallstones, gallbladder wall thickening, or biliary dilatation. Pancreas: Unremarkable. No pancreatic ductal dilatation or surrounding inflammatory changes. Spleen: Normal in size without focal abnormality. Adrenals/Urinary Tract: The adrenal glands are normal. Bilateral kidney stones. The largest right renal calculus is in the midpole measuring 5 mm. The largest on the left is in the lower pole measuring 3 mm. Left-sided hydroureter identified. At the left UVJ there is a 3 mm stone, image 84 of series 2. Stomach/Bowel: Stomach is within normal limits.  Appendix appears normal. No evidence of bowel wall thickening, distention, or inflammatory changes. Vascular/Lymphatic: Normal appearance of the abdominal aorta. No enlarged retroperitoneal or mesenteric adenopathy. No enlarged pelvic or inguinal lymph nodes. Reproductive: Uterus and bilateral adnexa are unremarkable. Other: No abdominal wall hernia or abnormality. No abdominopelvic ascites. Musculoskeletal: No acute or significant osseous findings. IMPRESSION: 1. Bilateral nephrolithiasis. 2. Left UVJ calculus measures 3 mm resulting in hydroureter. Electronically Signed   By: Signa Kellaylor  Stroud M.D.   On: 08/24/2016 14:27    ____________________________________________   PROCEDURES  Procedure(s) performed: None  Procedures  Critical Care performed: No  ____________________________________________   INITIAL IMPRESSION / ASSESSMENT AND PLAN / ED COURSE  Pertinent labs & imaging results that were available during my care of the patient were reviewed by me and considered in my medical decision making (see chart for details).  Differential diagnosis includes but is not limited to, abdominal perforation, aortic dissection, cholecystitis, appendicitis, diverticulitis, colitis, esophagitis/gastritis, kidney stone, pyelonephritis, urinary tract infection, aortic aneurysm. All are considered in decision and treatment plan. Based upon the patient's presentation and risk factors, and no dictation of an elevated creatinine now proceed with a CT for stone  to evaluate for possible obstructive pathology.   Discussed with Dr. Linzie Collin (Urology), reviewed case and presentation. Recommends hydration, stop NSAIDS, and conservative management of stone.   ----------------------------------------- 3:29 PM on 08/24/2016 -----------------------------------------  Patient pain-free. Resting comfortably. We'll admit for further hydration, close observation and recheck of his chemistry. Does have evidence of acute  kidney injury. Patient agreeable.      ____________________________________________   FINAL CLINICAL IMPRESSION(S) / ED DIAGNOSES  Final diagnoses:  Kidney stone on left side  AKI (acute kidney injury) (HCC)  Dehydration      NEW MEDICATIONS STARTED DURING THIS VISIT:  New Prescriptions   No medications on file     Note:  This document was prepared using Dragon voice recognition software and may include unintentional dictation errors.     Sharyn Creamer, MD 08/24/16 1530

## 2016-08-24 NOTE — H&P (Signed)
[facility] - [place] [facility]   PATIENT NAME: Shaun Holder    MR#:  [id number]  DATE OF BIRTH:  11/27/1980  DATE OF ADMISSION:  08/24/2016  PRIMARY CARE PHYSICIAN: Kandyce RudBabaoff, Marcus, MD   REQUESTING/REFERRING PHYSICIAN: Dr. Sharyn CreamerMark Quale  CHIEF COMPLAINT:   Chief Complaint  Patient presents with  . Flank Pain    HISTORY OF PRESENT ILLNESS:  Shaun Holder  is a 36 y.o. male with a known history of Nephrolithiasis who presents to the hospital due to left-sided flank pain and nausea. Patient says she's been having left-sided flank pain for the past week which has been intermittent in nature associated with some nausea but no vomiting. He denies any fevers or chills. Patient had similar symptoms this past March when he had right-sided kidney stones which resolved on its own. He has an outpatient follow-up coming up with urology. He says his left-sided flank pain has progressively gotten worse and therefore he came to the ER for further evaluation. Patient has been taking ibuprofen at least 800 mg 2-3 times daily for the past week. He now presents to the ER and was noted to be in acute kidney injury and hospitalist services were contacted further treatment and evaluation.  PAST MEDICAL HISTORY:   Past Medical History:  Diagnosis Date  . Nephrolithiasis     PAST SURGICAL HISTORY:  History reviewed. No pertinent surgical history.  SOCIAL HISTORY:   Social History  Substance Use Topics  . Smoking status: Never Smoker  . Smokeless tobacco: Never Used  . Alcohol use No    FAMILY HISTORY:   Family History  Problem Relation Age of Onset  . Leukemia Paternal Grandfather   . Heart failure Paternal Grandfather     DRUG ALLERGIES:  No Known Allergies  REVIEW OF SYSTEMS:   Review of Systems  Constitutional: Negative for fever and weight loss.  HENT: Negative for congestion, nosebleeds and tinnitus.   Eyes: Negative for blurred vision, double vision and  redness.  Respiratory: Negative for cough, hemoptysis and shortness of breath.   Cardiovascular: Negative for chest pain, orthopnea, leg swelling and PND.  Gastrointestinal: Positive for abdominal pain (left Flank) and nausea. Negative for diarrhea, melena and vomiting.  Genitourinary: Negative for dysuria, hematuria and urgency.  Musculoskeletal: Negative for falls and joint pain.  Neurological: Negative for dizziness, tingling, sensory change, focal weakness, seizures, weakness and headaches.  Endo/Heme/Allergies: Negative for polydipsia. Does not bruise/bleed easily.  Psychiatric/Behavioral: Negative for depression and memory loss. The patient is not nervous/anxious.     MEDICATIONS AT HOME:   Prior to Admission medications   Medication Sig Start Date End Date Taking? Authorizing Provider  ibuprofen (ADVIL,MOTRIN) 200 MG tablet Take 200 mg by mouth every 6 (six) hours as needed.   Yes [provider]  ondansetron (ZOFRAN ODT) 4 MG disintegrating tablet Take 1 tablet (4 mg total) by mouth every 8 (eight) hours as needed for nausea or vomiting. 06/28/16  Yes Darci CurrentBrown, [place] N, MD  oxyCODONE-acetaminophen (ROXICET) 5-325 MG tablet Take 1 tablet by mouth every 4 (four) hours as needed for severe pain. 06/28/16  Yes Darci CurrentBrown, Lamb N, MD  tamsulosin [facility](FLOMAX) 0.4 MG CAPS capsule Take 1 capsule (0.4 mg total) by mouth daily after breakfast. 06/28/16  Yes Darci CurrentBrown, [place] N, MD  ibuprofen (ADVIL,MOTRIN) 800 MG tablet Take 1 tablet (800 mg total) by mouth every 8 (eight) hours as needed. Patient not taking: Reported on 08/24/2016 06/29/16   Nita SickleVeronese, [place], MD  VITAL SIGNS:  Blood pressure 124/77, pulse 79, temperature 97.7 F (36.5 C), temperature source Oral, resp. rate 16, height 5\' 10"  (1.778 m), weight 104.3 kg (230 lb), SpO2 93 %.  PHYSICAL EXAMINATION:  Physical Exam  GENERAL:  36 y.o.-year-old patient lying in bed in no acute distress.  EYES: Pupils equal, round, reactive  to light and accommodation. No scleral icterus. Extraocular muscles intact.  HEENT: Head atraumatic, normocephalic. Oropharynx and nasopharynx clear. No oropharyngeal erythema, moist oral mucosa  NECK:  Supple, no jugular venous distention. No thyroid enlargement, no tenderness.  LUNGS: Normal breath sounds bilaterally, no wheezing, rales, rhonchi. No use of accessory muscles of respiration.  CARDIOVASCULAR: S1, S2 RRR. No murmurs, rubs, gallops, clicks.  ABDOMEN: Soft, nontender, nondistended. Bowel sounds present. No organomegaly or mass.  EXTREMITIES: No pedal edema, cyanosis, or clubbing. + 2 pedal & radial pulses b/l.   NEUROLOGIC: Cranial nerves II through XII are intact. No focal Motor or sensory deficits appreciated b/l PSYCHIATRIC: The patient is alert and oriented x 3.  SKIN: No obvious rash, lesion, or ulcer.   LABORATORY PANEL:   CBC  Recent Labs Lab 08/24/16 1237  WBC 10.5  HGB 14.1  HCT 39.7*  PLT 216   ------------------------------------------------------------------------------------------------------------------  Chemistries   Recent Labs Lab 08/24/16 1237  NA 139  K 4.7  CL 103  CO2 28  GLUCOSE 93  BUN 23*  CREATININE 2.41*  CALCIUM 9.2  AST 27  ALT 22  ALKPHOS 71  BILITOT 1.0   ------------------------------------------------------------------------------------------------------------------  Cardiac Enzymes No results for input(s): TROPONINI in the last 168 hours. ------------------------------------------------------------------------------------------------------------------  RADIOLOGY:  Ct Renal Stone Study  Result Date: 08/24/2016 CLINICAL DATA:  Left flank pain for 1 week. EXAM: CT ABDOMEN AND PELVIS WITHOUT CONTRAST TECHNIQUE: Multidetector CT imaging of the abdomen and pelvis was performed following the standard protocol without IV contrast. COMPARISON:  None. FINDINGS: Lower chest: No acute abnormality. Hepatobiliary: No focal liver  abnormality is seen. No gallstones, gallbladder wall thickening, or biliary dilatation. Pancreas: Unremarkable. No pancreatic ductal dilatation or surrounding inflammatory changes. Spleen: Normal in size without focal abnormality. Adrenals/Urinary Tract: The adrenal glands are normal. Bilateral kidney stones. The largest right renal calculus is in the midpole measuring 5 mm. The largest on the left is in the lower pole measuring 3 mm. Left-sided hydroureter identified. At the left UVJ there is a 3 mm stone, image 84 of series 2. Stomach/Bowel: Stomach is within normal limits. Appendix appears normal. No evidence of bowel wall thickening, distention, or inflammatory changes. Vascular/Lymphatic: Normal appearance of the abdominal aorta. No enlarged retroperitoneal or mesenteric adenopathy. No enlarged pelvic or inguinal lymph nodes. Reproductive: Uterus and bilateral adnexa are unremarkable. Other: No abdominal wall hernia or abnormality. No abdominopelvic ascites. Musculoskeletal: No acute or significant osseous findings. IMPRESSION: 1. Bilateral nephrolithiasis. 2. Left UVJ calculus measures 3 mm resulting in hydroureter. Electronically Signed   By: Signa Kell M.D.   On: 08/24/2016 14:27     IMPRESSION AND PLAN:   36 year old male with past medical history nephrolithiasis presents to the hospital due to left-sided flank pain and nausea.  1. Acute kidney injury-secondary to dehydration and also increasing use of NSAIDs. -We'll aggressively hydrate the patient with IV fluids, stop any NSAIDs. Follow BUN/creatinine. -Renal dose meds, avoid nephrotoxins.  2. Nephrolithiasis-patient has previous history of right-sided nephrolithiasis and had a urology follow-up coming up. He now presents with a small 3 mm stone at the left UVJ junction. -ER physician spoke urology recommended supportive  care with no acute surgical intervention. I will continue pain control with Norco, aggressive IV fluid hydration,  continue Flomax.    All the records are reviewed and case discussed with ED provider. Management plans discussed with the patient, family and they are in agreement.  CODE STATUS: Full  TOTAL TIME TAKING CARE OF THIS PATIENT: 40 minutes.    Houston Siren M.D on 08/24/2016 at 3:50 PM  Between 7am to 6pm - Pager - [phone]  After 6pm go to [email or web address] - password [email or web address] [facility]  [place] [facility]  Office  [phone]  CC: Primary care physician; Kandyce Rud, MD

## 2016-08-25 LAB — BASIC METABOLIC PANEL
Anion gap: 4 — ABNORMAL LOW (ref 5–15)
BUN: 19 mg/dL (ref 6–20)
CHLORIDE: 108 mmol/L (ref 101–111)
CO2: 28 mmol/L (ref 22–32)
Calcium: 8.4 mg/dL — ABNORMAL LOW (ref 8.9–10.3)
Creatinine, Ser: 1.6 mg/dL — ABNORMAL HIGH (ref 0.61–1.24)
GFR calc Af Amer: >60 mL/min (ref >60)
GFR calc non Af Amer: 55 mL/min — ABNORMAL LOW (ref >60)
GLUCOSE: 98 mg/dL (ref 65–99)
POTASSIUM: 4.3 mmol/L (ref 3.5–5.1)
SODIUM: 140 mmol/L (ref 135–145)

## 2016-08-25 MED ORDER — ACETAMINOPHEN 325 MG PO TABS: 650.0000 mg | ORAL_TABLET | Freq: Four times a day (QID) | ORAL | Status: DC | PRN

## 2016-08-25 NOTE — Care Management Obs Status (Signed)
MEDICARE OBSERVATION STATUS NOTIFICATION   Patient Details  Name: Shaun Holder MRN: [id number] Date of Birth: 1980/05/20   Medicare Observation Status Notification Given:  No (discharged in less than 24 hours)    Hammond Obeirne A, RN 08/25/2016, 10:35 AM

## 2016-08-25 NOTE — Progress Notes (Signed)
Pt passed stone

## 2016-08-25 NOTE — Discharge Summary (Signed)
[facility] - [place] at [facility], [place] y.o., DOB Mar 28, 1981, MRN [id number]. Admission date: 08/24/2016 Discharge Date 08/25/2016 Primary MD Kandyce Rud, MD Admitting Physician Houston Siren, MD  Admission Diagnosis  Dehydration [E86.0] AKI (acute kidney injury) (HCC) [N17.9] Kidney stone on left side [N20.0]  Discharge Diagnosis   Active Problems:   Acute renal failure (ARF) (HCC)  Recurrent kidney stones        Hospital Course Shaun Holder  is a 36 y.o. male with a known history of Nephrolithiasis who presents to the hospital due to left-sided flank pain and nausea. Patient says she's been having left-sided flank pain for the past week which has been intermittent in nature associated with some nausea but no vomiting. He underwent a CT per renal protocol which confirmed multiple stones. He was seen by urology who recommended supportive care. However due to his acute renal failure he was admitted to the hospital for further evaluation and therapy. Patient's renal failure was thought to be due to possible NSAID use. And dehydration. He was given IV fluids his renal function is much improved. Looking at his renal function from few months back he does have some elevated creatinine. Patient needs to have renal function monitor per primary care provider if continues to be elevated may need nephrology evaluation.  Patient did pass a stone that was sent for analysis his urologist will need to obtain these results.             Consults  urology  Significant Tests:  See full reports for all details    Ct Renal Stone Study  Result Date: 08/24/2016 CLINICAL DATA:  Left flank pain for 1 week. EXAM: CT ABDOMEN AND PELVIS WITHOUT CONTRAST TECHNIQUE: Multidetector CT imaging of the abdomen and pelvis was performed following the standard protocol without IV contrast. COMPARISON:  None. FINDINGS: Lower chest: No acute abnormality. Hepatobiliary: No focal  liver abnormality is seen. No gallstones, gallbladder wall thickening, or biliary dilatation. Pancreas: Unremarkable. No pancreatic ductal dilatation or surrounding inflammatory changes. Spleen: Normal in size without focal abnormality. Adrenals/Urinary Tract: The adrenal glands are normal. Bilateral kidney stones. The largest right renal calculus is in the midpole measuring 5 mm. The largest on the left is in the lower pole measuring 3 mm. Left-sided hydroureter identified. At the left UVJ there is a 3 mm stone, image 84 of series 2. Stomach/Bowel: Stomach is within normal limits. Appendix appears normal. No evidence of bowel wall thickening, distention, or inflammatory changes. Vascular/Lymphatic: Normal appearance of the abdominal aorta. No enlarged retroperitoneal or mesenteric adenopathy. No enlarged pelvic or inguinal lymph nodes. Reproductive: Uterus and bilateral adnexa are unremarkable. Other: No abdominal wall hernia or abnormality. No abdominopelvic ascites. Musculoskeletal: No acute or significant osseous findings. IMPRESSION: 1. Bilateral nephrolithiasis. 2. Left UVJ calculus measures 3 mm resulting in hydroureter. Electronically Signed   By: Signa Kell M.D.   On: 08/24/2016 14:27       Today   Subjective:   Shaun Holder   Feels better passed stone. No pain  Objective:   Blood pressure 126/76, pulse 83, temperature 97.4 F (36.3 C), temperature source Oral, resp. rate 17, height 5\' 10"  (1.778 m), weight 237 lb 3.2 oz (107.6 kg), SpO2 94 %.  .  Intake/Output Summary (Last 24 hours) at 08/25/16 1300 Last data filed at 08/25/16 0818  Gross per 24 hour  Intake             2998 ml  Output  2630 ml  Net              368 ml    Exam VITAL SIGNS: Blood pressure 126/76, pulse 83, temperature 97.4 F (36.3 C), temperature source Oral, resp. rate 17, height 5\' 10"  (1.778 m), weight 237 lb 3.2 oz (107.6 kg), SpO2 94 %.  GENERAL:  36 y.o.-year-old patient lying in the bed  with no acute distress.  EYES: Pupils equal, round, reactive to light and accommodation. No scleral icterus. Extraocular muscles intact.  HEENT: Head atraumatic, normocephalic. Oropharynx and nasopharynx clear.  NECK:  Supple, no jugular venous distention. No thyroid enlargement, no tenderness.  LUNGS: Normal breath sounds bilaterally, no wheezing, rales,rhonchi or crepitation. No use of accessory muscles of respiration.  CARDIOVASCULAR: S1, S2 normal. No murmurs, rubs, or gallops.  ABDOMEN: Soft, nontender, nondistended. Bowel sounds present. No organomegaly or mass.  EXTREMITIES: No pedal edema, cyanosis, or clubbing.  NEUROLOGIC: Cranial nerves II through XII are intact. Muscle strength 5/5 in all extremities. Sensation intact. Gait not checked.  PSYCHIATRIC: The patient is alert and oriented x 3.  SKIN: No obvious rash, lesion, or ulcer.   Data Review     CBC w Diff: Lab Results  Component Value Date   WBC 10.5 08/24/2016   HGB 14.1 08/24/2016   HCT 39.7 (L) 08/24/2016   PLT 216 08/24/2016   LYMPHOPCT 38 06/28/2016   MONOPCT 10 06/28/2016   EOSPCT 2 06/28/2016   BASOPCT 1 06/28/2016   CMP: Lab Results  Component Value Date   NA 140 08/25/2016   K 4.3 08/25/2016   CL 108 08/25/2016   CO2 28 08/25/2016   BUN 19 08/25/2016   CREATININE 1.60 (H) 08/25/2016   PROT 7.4 08/24/2016   ALBUMIN 4.3 08/24/2016   BILITOT 1.0 08/24/2016   ALKPHOS 71 08/24/2016   AST 27 08/24/2016   ALT 22 08/24/2016  .  Micro Results No results found for this or any previous visit (from the past 240 hour(s)).      Code Status Orders        Start     Ordered   08/24/16 1648  Full code  Continuous     08/24/16 1647    Code Status History    Date Active Date Inactive Code Status Order ID Comments User Context   This patient has a current code status but no historical code status.          Follow-up Information    [facility], [place], MD. Schedule an appointment as soon as  possible for a visit in 1 week(s).           Discharge Medications   Allergies as of 08/25/2016   No Known Allergies     Medication List    STOP taking these medications   ibuprofen 200 MG tablet Commonly known as:  ADVIL,MOTRIN   ibuprofen 800 MG tablet Commonly known as:  ADVIL,MOTRIN     TAKE these medications   acetaminophen 325 MG tablet Commonly known as:  TYLENOL Take 2 tablets (650 mg total) by mouth every 6 (six) hours as needed for mild pain (or Fever >/= 101).   ondansetron 4 MG disintegrating tablet Commonly known as:  ZOFRAN ODT Take 1 tablet (4 mg total) by mouth every 8 (eight) hours as needed for nausea or vomiting.   oxyCODONE-acetaminophen 5-325 MG tablet Commonly known as:  ROXICET Take 1 tablet by mouth every 4 (four) hours as needed for severe pain.   tamsulosin  0.4 MG Caps capsule Commonly known as:  FLOMAX Take 1 capsule (0.4 mg total) by mouth daily after breakfast.          Total Time in preparing paper work, data evaluation and todays exam - 35 minutes  Auburn BilberryPATEL, Stein Windhorst M.D on 08/25/2016 at 1:00 [facility]  [facility]   Office  [phone]

## 2016-08-28 ENCOUNTER — Telehealth: Payer: Self-pay | Admitting: Urology

## 2016-08-28 NOTE — Telephone Encounter (Signed)
-----   Message from Crist FatBenjamin W Herrick, MD sent at 08/24/2016  3:34 PM EDT ----- Regarding: f/u Patient seen in ED, needs to be seen urgently for follow-up of his stone/acute renal failure. Thanks, ben

## 2016-08-28 NOTE — Telephone Encounter (Signed)
done

## 2016-08-29 ENCOUNTER — Encounter: Payer: Self-pay | Admitting: Urology

## 2016-08-29 ENCOUNTER — Ambulatory Visit (INDEPENDENT_AMBULATORY_CARE_PROVIDER_SITE_OTHER): Payer: Managed Care, Other (non HMO) | Admitting: Urology

## 2016-08-29 VITALS — BP 126/82 | HR 87 | Ht 70.0 in | Wt 236.8 lb

## 2016-08-29 DIAGNOSIS — N200 Calculus of kidney: Secondary | ICD-10-CM | POA: Diagnosis not present

## 2016-08-29 LAB — URINALYSIS, COMPLETE
Bilirubin, UA: NEGATIVE
Glucose, UA: NEGATIVE
Ketones, UA: NEGATIVE
LEUKOCYTES UA: NEGATIVE
NITRITE UA: NEGATIVE
PH UA: 7.5 (ref 5.0–7.5)
Protein, UA: NEGATIVE
RBC UA: NEGATIVE
Specific Gravity, UA: 1.015 (ref 1.005–1.030)
Urobilinogen, Ur: 0.2 mg/dL (ref 0.2–1.0)

## 2016-08-29 NOTE — Progress Notes (Signed)
08/29/2016 11:47 AM   Shaun Holder 1981/03/08 [id number]  Referring provider: Kandyce RudBabaoff, Marcus, MD [phone] [address] [facility] [place] [facility] [place], [place] [id number]  Chief Complaint  Patient presents with  . Nephrolithiasis    HPI: The patient presents for follow-up after being diagnosed with a 3 mm left UVJ calculus with proximal hydroureteronephrosis.  CT also revealed bilateral nonobstructing nephrolithiasis with the largest being a right 5 mm midpole stone in a left 3 mm lower pole stone. He eventually passed the stone while in the hospital. It was sent for stone analysis which is pending. He did have ATN at the time of his obstructive uropathy with a creatinine of 2.4. After stone passage, it returned to baseline near 1.6. This is his third stone episode. He has passed all stones spontaneously. He is not sure what his stones are made out of. He has never had surgery for stones.   PMH: Past Medical History:  Diagnosis Date  . Nephrolithiasis     Surgical History: History reviewed. No pertinent surgical history.  Home Medications:  Allergies as of 08/29/2016   No Known Allergies     Medication List       Accurate as of 08/29/16 11:47 AM. Always use your most recent med list.          acetaminophen 325 MG tablet Commonly known as:  TYLENOL Take 2 tablets (650 mg total) by mouth every 6 (six) hours as needed for mild pain (or Fever >/= 101).   ondansetron 4 MG disintegrating tablet Commonly known as:  ZOFRAN ODT Take 1 tablet (4 mg total) by mouth every 8 (eight) hours as needed for nausea or vomiting.   oxyCODONE-acetaminophen 5-325 MG tablet Commonly known as:  ROXICET Take 1 tablet by mouth every 4 (four) hours as needed for severe pain.   tamsulosin 0.4 MG Caps capsule Commonly known as:  FLOMAX Take 1 capsule (0.4 mg total) by mouth daily after breakfast.       Allergies: No Known Allergies  Family History: Family History    Problem Relation Age of Onset  . Leukemia Paternal Grandfather   . Heart failure Paternal Grandfather   . Prostate cancer Neg Hx   . Bladder Cancer Neg Hx   . Kidney cancer Neg Hx     Social History:  reports that he has never smoked. He has never used smokeless tobacco. He reports that he does not drink alcohol or use drugs.  ROS: UROLOGY Frequent Urination?: No Hard to postpone urination?: No Burning/pain with urination?: No Get up at night to urinate?: No Leakage of urine?: No Urine stream starts and stops?: No Trouble starting stream?: No Do you have to strain to urinate?: No Blood in urine?: No Urinary tract infection?: No Sexually transmitted disease?: No Injury to kidneys or bladder?: Yes Painful intercourse?: No Weak stream?: No Erection problems?: No Penile pain?: No  Gastrointestinal Nausea?: No Vomiting?: No Indigestion/heartburn?: No Diarrhea?: No Constipation?: No  Constitutional Fever: No Night sweats?: No Weight loss?: No Fatigue?: No  Skin Skin rash/lesions?: No Itching?: No  Eyes Blurred vision?: No Double vision?: No  Ears/Nose/Throat Sore throat?: No Sinus problems?: No  Hematologic/Lymphatic Swollen glands?: No Easy bruising?: No  Cardiovascular Leg swelling?: No Chest pain?: No  Respiratory Cough?: No Shortness of breath?: No  Endocrine Excessive thirst?: No  Musculoskeletal Back pain?: No Joint pain?: No  Neurological Headaches?: No Dizziness?: No  Psychologic Depression?: No Anxiety?: No  Physical Exam:  BP 126/82 (BP Location: Left Arm, Patient Position: Sitting, Cuff Size: Normal)   Pulse 87   Ht 5\' 10"  (1.778 m)   Wt 236 lb 12.8 oz (107.4 kg)   BMI 33.98 kg/m   Constitutional:  Alert and oriented, No acute distress. HEENT: [place] AT, moist mucus membranes.  Trachea midline, no masses. Cardiovascular: No clubbing, cyanosis, or edema. Respiratory: Normal respiratory effort, no increased work of  breathing. GI: Abdomen is soft, nontender, nondistended, no abdominal masses GU: No CVA tenderness.  Skin: No rashes, bruises or suspicious lesions. Lymph: No cervical or inguinal adenopathy. Neurologic: Grossly intact, no focal deficits, moving all 4 extremities. Psychiatric: Normal mood and affect.  Laboratory Data: Lab Results  Component Value Date   WBC 10.5 08/24/2016   HGB 14.1 08/24/2016   HCT 39.7 (L) 08/24/2016   MCV 87.3 08/24/2016   PLT 216 08/24/2016    Lab Results  Component Value Date   CREATININE 1.60 (H) 08/25/2016    No results found for: PSA  No results found for: TESTOSTERONE  No results found for: HGBA1C  Urinalysis    Component Value Date/Time   COLORURINE YELLOW (A) 08/24/2016 1237   APPEARANCEUR CLEAR (A) 08/24/2016 1237   LABSPEC 1.013 08/24/2016 1237   PHURINE 5.0 08/24/2016 1237   GLUCOSEU NEGATIVE 08/24/2016 1237   HGBUR NEGATIVE 08/24/2016 1237   BILIRUBINUR NEGATIVE 08/24/2016 1237   KETONESUR NEGATIVE 08/24/2016 1237   PROTEINUR NEGATIVE 08/24/2016 1237   NITRITE NEGATIVE 08/24/2016 1237   LEUKOCYTESUR NEGATIVE 08/24/2016 1237    Pertinent Imaging: Ct reviewed as above  Assessment & Plan:    1. Left UVJ stone Patient has passed his stone. Stone analysis is pending. I went over the ABCs of stone formation with the patient under the presumption that he had a calcium oxalate stone. The stone analysis was not ordered by [place], so the patient will plan to call the office in a month for his results since it will not be automatically forwarded to me. We discussed undergoing a 24-hour urine study given his young age and recurrent nephrolithiasis. He is currently not interested in this but will call the office if he changes his mind.  2. Bilateral nonobstructing nephrolithiasis Follow-up in one year with KUB to ensure no significant change in size.   Return in about 1 year (around 08/29/2017) for KUB prior.  Hildred Laser,  MD  [facility] [address] [place], [place] [address] [phone]

## 2016-09-02 LAB — STONE ANALYSIS
Ca Oxalate,Monohydr.: 95 %
Ca phos cry stone ql IR: 5 %
Stone Weight KSTONE: 37.7 mg

## 2017-01-20 ENCOUNTER — Other Ambulatory Visit: Payer: Self-pay | Admitting: Urology

## 2017-02-10 ENCOUNTER — Encounter (HOSPITAL_BASED_OUTPATIENT_CLINIC_OR_DEPARTMENT_OTHER): Payer: Self-pay | Admitting: *Deleted

## 2017-02-10 ENCOUNTER — Other Ambulatory Visit: Payer: Self-pay

## 2017-02-10 NOTE — Progress Notes (Signed)
NPO AFTER MN W/ EXCEPTION CLEAR LIQUIDS UNTIL 0700 (NO CREAM/MILK PRODUCTS).  ARRIVE AT 1100.  NEEDS HG.  

## 2017-02-13 ENCOUNTER — Ambulatory Visit (HOSPITAL_BASED_OUTPATIENT_CLINIC_OR_DEPARTMENT_OTHER): Payer: Managed Care, Other (non HMO) | Admitting: Anesthesiology

## 2017-02-13 ENCOUNTER — Ambulatory Visit (HOSPITAL_BASED_OUTPATIENT_CLINIC_OR_DEPARTMENT_OTHER)
Admission: RE | Admit: 2017-02-13 | Discharge: 2017-02-13 | Disposition: A | Discharge status: Home / Self Care | Payer: Managed Care, Other (non HMO) | Source: Ambulatory Visit | Attending: Urology | Admitting: Urology

## 2017-02-13 ENCOUNTER — Other Ambulatory Visit: Payer: Self-pay

## 2017-02-13 ENCOUNTER — Encounter (HOSPITAL_BASED_OUTPATIENT_CLINIC_OR_DEPARTMENT_OTHER)
Admission: RE | Disposition: A | Discharge status: Home / Self Care | Payer: Self-pay | Source: Ambulatory Visit | Attending: Urology

## 2017-02-13 ENCOUNTER — Encounter (HOSPITAL_BASED_OUTPATIENT_CLINIC_OR_DEPARTMENT_OTHER): Payer: Self-pay | Admitting: Anesthesiology

## 2017-02-13 DIAGNOSIS — Z8249 Family history of ischemic heart disease and other diseases of the circulatory system: Secondary | ICD-10-CM | POA: Diagnosis not present

## 2017-02-13 DIAGNOSIS — Z87442 Personal history of urinary calculi: Secondary | ICD-10-CM | POA: Insufficient documentation

## 2017-02-13 DIAGNOSIS — Z6832 Body mass index (BMI) 32.0-32.9, adult: Secondary | ICD-10-CM | POA: Insufficient documentation

## 2017-02-13 DIAGNOSIS — E669 Obesity, unspecified: Secondary | ICD-10-CM | POA: Insufficient documentation

## 2017-02-13 DIAGNOSIS — Z302 Encounter for sterilization: Secondary | ICD-10-CM | POA: Insufficient documentation

## 2017-02-13 DIAGNOSIS — N471 Phimosis: Secondary | ICD-10-CM | POA: Diagnosis present

## 2017-02-13 HISTORY — DX: Phimosis: N47.1

## 2017-02-13 HISTORY — DX: Presence of spectacles and contact lenses: Z97.3

## 2017-02-13 HISTORY — PX: VASECTOMY: SHX75

## 2017-02-13 HISTORY — DX: Personal history of urinary calculi: Z87.442

## 2017-02-13 HISTORY — PX: CIRCUMCISION: SHX1350

## 2017-02-13 LAB — POCT HEMOGLOBIN-HEMACUE: HEMOGLOBIN: 15.1 g/dL (ref 13.0–17.0)

## 2017-02-13 SURGERY — VASECTOMY
Anesthesia: General

## 2017-02-13 MED ORDER — CEFAZOLIN SODIUM-DEXTROSE 2-4 GM/100ML-% IV SOLN
INTRAVENOUS | Status: AC
  Filled 2017-02-13: qty 100

## 2017-02-13 MED ORDER — MIDAZOLAM HCL 5 MG/5ML IJ SOLN
INTRAMUSCULAR | Status: DC | PRN
  Administered 2017-02-13: 2 mg via INTRAVENOUS

## 2017-02-13 MED ORDER — OXYCODONE-ACETAMINOPHEN 5-325 MG PO TABS: 1.0000 | ORAL_TABLET | ORAL | 0 refills | Status: DC | PRN

## 2017-02-13 MED ORDER — OXYCODONE HCL 5 MG PO TABS
5.0000 mg | ORAL_TABLET | Freq: Once | ORAL | Status: AC
  Administered 2017-02-13: 5 mg via ORAL
  Filled 2017-02-13: qty 1

## 2017-02-13 MED ORDER — DEXAMETHASONE SODIUM PHOSPHATE 10 MG/ML IJ SOLN
INTRAMUSCULAR | Status: AC
  Filled 2017-02-13: qty 1

## 2017-02-13 MED ORDER — PROPOFOL 10 MG/ML IV BOLUS
INTRAVENOUS | Status: AC
  Filled 2017-02-13: qty 40

## 2017-02-13 MED ORDER — BUPIVACAINE HCL (PF) 0.25 % IJ SOLN
INTRAMUSCULAR | Status: DC | PRN
Start: 2017-02-13 — End: 2017-02-13
  Administered 2017-02-13: 10 mL

## 2017-02-13 MED ORDER — FENTANYL CITRATE (PF) 100 MCG/2ML IJ SOLN
INTRAMUSCULAR | Status: AC
  Filled 2017-02-13: qty 2

## 2017-02-13 MED ORDER — ACETAMINOPHEN 500 MG PO TABS
ORAL_TABLET | ORAL | Status: AC
  Filled 2017-02-13: qty 2

## 2017-02-13 MED ORDER — OXYCODONE HCL 5 MG PO TABS
ORAL_TABLET | ORAL | Status: AC
  Filled 2017-02-13: qty 1

## 2017-02-13 MED ORDER — DIAZEPAM 5 MG PO TABS: 5.0000 mg | ORAL_TABLET | Freq: Every day | ORAL | 0 refills | Status: AC

## 2017-02-13 MED ORDER — LACTATED RINGERS IV SOLN
INTRAVENOUS | Status: DC
  Administered 2017-02-13 (×2): via INTRAVENOUS
  Filled 2017-02-13: qty 1000

## 2017-02-13 MED ORDER — FENTANYL CITRATE (PF) 100 MCG/2ML IJ SOLN
INTRAMUSCULAR | Status: DC | PRN
  Administered 2017-02-13: 50 ug via INTRAVENOUS
  Administered 2017-02-13 (×3): 25 ug via INTRAVENOUS
  Administered 2017-02-13: 50 ug via INTRAVENOUS
  Administered 2017-02-13: 25 ug via INTRAVENOUS

## 2017-02-13 MED ORDER — LIDOCAINE 2% (20 MG/ML) 5 ML SYRINGE
INTRAMUSCULAR | Status: DC | PRN
  Administered 2017-02-13: 100 mg via INTRAVENOUS

## 2017-02-13 MED ORDER — ONDANSETRON HCL 4 MG/2ML IJ SOLN
INTRAMUSCULAR | Status: DC | PRN
  Administered 2017-02-13: 4 mg via INTRAVENOUS

## 2017-02-13 MED ORDER — PROPOFOL 10 MG/ML IV BOLUS
INTRAVENOUS | Status: DC | PRN
  Administered 2017-02-13: 20 mg via INTRAVENOUS
  Administered 2017-02-13: 200 mg via INTRAVENOUS
  Administered 2017-02-13: 30 mg via INTRAVENOUS

## 2017-02-13 MED ORDER — DEXAMETHASONE SODIUM PHOSPHATE 4 MG/ML IJ SOLN
INTRAMUSCULAR | Status: DC | PRN
  Administered 2017-02-13: 10 mg via INTRAVENOUS

## 2017-02-13 MED ORDER — LIDOCAINE 2% (20 MG/ML) 5 ML SYRINGE
INTRAMUSCULAR | Status: AC
  Filled 2017-02-13: qty 5

## 2017-02-13 MED ORDER — ONDANSETRON HCL 4 MG/2ML IJ SOLN
INTRAMUSCULAR | Status: AC
Start: 2017-02-13 — End: ?
  Filled 2017-02-13: qty 2

## 2017-02-13 MED ORDER — FENTANYL CITRATE (PF) 100 MCG/2ML IJ SOLN
25.0000 ug | INTRAMUSCULAR | Status: DC | PRN
  Filled 2017-02-13: qty 1

## 2017-02-13 MED ORDER — PROMETHAZINE HCL 25 MG/ML IJ SOLN
6.2500 mg | INTRAMUSCULAR | Status: DC | PRN
  Filled 2017-02-13: qty 1

## 2017-02-13 MED ORDER — ACETAMINOPHEN 500 MG PO TABS
1000.0000 mg | ORAL_TABLET | Freq: Once | ORAL | Status: AC
  Administered 2017-02-13: 1000 mg via ORAL
  Filled 2017-02-13: qty 2

## 2017-02-13 MED ORDER — CEFAZOLIN SODIUM-DEXTROSE 2-4 GM/100ML-% IV SOLN
2.0000 g | INTRAVENOUS | Status: AC
  Administered 2017-02-13: 2 g via INTRAVENOUS
  Filled 2017-02-13: qty 100

## 2017-02-13 MED ORDER — MIDAZOLAM HCL 2 MG/2ML IJ SOLN
INTRAMUSCULAR | Status: AC
  Filled 2017-02-13: qty 2

## 2017-02-13 SURGICAL SUPPLY — 50 items
BANDAGE COBAN STERILE 2 (GAUZE/BANDAGES/DRESSINGS) ×4 IMPLANT
BLADE CLIPPER SENSICLIP SURGIC (BLADE) IMPLANT
BLADE CLIPPER SURG (BLADE) IMPLANT
BLADE SURG 15 STRL LF DISP TIS (BLADE) ×2 IMPLANT
BLADE SURG 15 STRL SS (BLADE) ×2
BNDG CONFORM 2 STRL LF (GAUZE/BANDAGES/DRESSINGS) ×4 IMPLANT
BNDG GAUZE ELAST 4 BULKY (GAUZE/BANDAGES/DRESSINGS) ×4 IMPLANT
CLOTH BEACON ORANGE TIMEOUT ST (SAFETY) ×4 IMPLANT
COVER BACK TABLE 60X90IN (DRAPES) ×4 IMPLANT
COVER MAYO STAND STRL (DRAPES) ×8 IMPLANT
DECANTER SPIKE VIAL GLASS SM (MISCELLANEOUS) IMPLANT
DERMABOND ADVANCED (GAUZE/BANDAGES/DRESSINGS) ×2
DERMABOND ADVANCED .7 DNX12 (GAUZE/BANDAGES/DRESSINGS) ×2 IMPLANT
DRAPE LAPAROTOMY 100X72 PEDS (DRAPES) ×4 IMPLANT
ELECT NEEDLE BLADE 2-5/6 (NEEDLE) ×4 IMPLANT
ELECT NEEDLE TIP 2.8 STRL (NEEDLE) ×4 IMPLANT
ELECT REM PT RETURN 9FT ADLT (ELECTROSURGICAL) ×4
ELECTRODE REM PT RTRN 9FT ADLT (ELECTROSURGICAL) ×2 IMPLANT
GAUZE SPONGE 4X4 16PLY XRAY LF (GAUZE/BANDAGES/DRESSINGS) ×4 IMPLANT
GAUZE SPONGE 4X4 8PLY STR LF (GAUZE/BANDAGES/DRESSINGS) ×4 IMPLANT
GAUZE XEROFORM 1X8 LF (GAUZE/BANDAGES/DRESSINGS) ×4 IMPLANT
GLOVE BIO SURGEON STRL SZ8 (GLOVE) ×4 IMPLANT
GLOVE BIOGEL PI IND STRL 7.5 (GLOVE) ×2 IMPLANT
GLOVE BIOGEL PI IND STRL 8.5 (GLOVE) ×2 IMPLANT
GLOVE BIOGEL PI INDICATOR 7.5 (GLOVE) ×2
GLOVE BIOGEL PI INDICATOR 8.5 (GLOVE) ×2
GLOVE ECLIPSE 8.5 STRL (GLOVE) ×4 IMPLANT
GOWN STRL REUS W/ TWL XL LVL3 (GOWN DISPOSABLE) IMPLANT
GOWN STRL REUS W/TWL XL LVL3 (GOWN DISPOSABLE) ×8 IMPLANT
GOWN W/2 COTTON TOWELS 2 STD (GOWNS) IMPLANT
KIT RM TURNOVER CYSTO AR (KITS) ×4 IMPLANT
MANIFOLD NEPTUNE II (INSTRUMENTS) IMPLANT
NEEDLE HYPO 25X1 1.5 SAFETY (NEEDLE) ×4 IMPLANT
NEEDLE HYPO 25X5/8 SAFETYGLIDE (NEEDLE) IMPLANT
NS IRRIG 500ML POUR BTL (IV SOLUTION) IMPLANT
PACK BASIN DAY SURGERY FS (CUSTOM PROCEDURE TRAY) ×4 IMPLANT
PENCIL BUTTON HOLSTER BLD 10FT (ELECTRODE) ×4 IMPLANT
SUPPORT SCROTAL LG STRP (MISCELLANEOUS) ×3 IMPLANT
SUPPORTER ATHLETIC LG (MISCELLANEOUS) ×1
SUT CHROMIC 3 0 PS 2 (SUTURE) ×4 IMPLANT
SUT SILK 2 0 (SUTURE) ×2
SUT SILK 2-0 18XBRD TIE 12 (SUTURE) ×2 IMPLANT
SUT VICRYL 4-0 PS2 18IN ABS (SUTURE) ×8 IMPLANT
SYR CONTROL 10ML LL (SYRINGE) ×4 IMPLANT
TOWEL OR 17X24 6PK STRL BLUE (TOWEL DISPOSABLE) ×4 IMPLANT
TOWEL OR 17X26 10 PK STRL BLUE (TOWEL DISPOSABLE) ×8 IMPLANT
TRAY DSU PREP LF (CUSTOM PROCEDURE TRAY) ×4 IMPLANT
TUBE CONNECTING 12'X1/4 (SUCTIONS)
TUBE CONNECTING 12X1/4 (SUCTIONS) IMPLANT
WATER STERILE IRR 500ML POUR (IV SOLUTION) IMPLANT

## 2017-02-13 NOTE — Transfer of Care (Signed)
  Last Vitals:  Vitals:   02/13/17 1120 02/13/17 1421  BP: (!) 149/90 106/85  Pulse: 78 92  Resp: 16 (!) 21  Temp: 36.7 C 36.7 C  SpO2: 100% 100%    Last Pain:  Vitals:   02/13/17 1120  TempSrc: Oral         Immediate Anesthesia Transfer of Care Note  Patient: Shaun Holder  Procedure(s) Performed: Procedure(s) (LRB): VASECTOMY (Bilateral) CIRCUMCISION ADULT (N/A)  Patient Location: PACU  Anesthesia Type: General  Level of Consciousness: awake, alert  and oriented  Airway & Oxygen Therapy: Patient Spontanous Breathing and Patient connected to nasal cannula oxygen  Post-op Assessment: Report given to PACU RN and Post -op Vital signs reviewed and stable  Post vital signs: Reviewed and stable  Complications: No apparent anesthesia complications

## 2017-02-13 NOTE — Anesthesia Postprocedure Evaluation (Signed)
Anesthesia Post Note  Patient: Shaun Holder  Procedure(s) Performed: VASECTOMY (Bilateral ) CIRCUMCISION ADULT (N/A )     Patient location during evaluation: PACU Anesthesia Type: General Level of consciousness: awake and alert Pain management: pain level controlled Vital Signs Assessment: post-procedure vital signs reviewed and stable Respiratory status: spontaneous breathing, nonlabored ventilation and respiratory function stable Cardiovascular status: blood pressure returned to baseline and stable Postop Assessment: no apparent nausea or vomiting Anesthetic complications: no    Last Vitals:  Vitals:   02/13/17 1445 02/13/17 1500  BP: 126/75 138/86  Pulse: 75 79  Resp: 12 16  Temp:    SpO2: 99% 98%    Last Pain:  Vitals:   02/13/17 1120  TempSrc: Oral                 Sarah-Jane Nazario A.

## 2017-02-13 NOTE — Discharge Instructions (Signed)
Circumcision, Adult, Care After  This sheet gives you information about how to care for yourself after your procedure. Your doctor may also give you more specific instructions. If you have problems or questions, contact your doctor.  Follow these instructions at home:  Cut care   Follow instructions from your doctor about how to take care of your cut from surgery (incision). Make sure you:  ? Wash your hands with soap and water before you change your bandage (dressing). If you cannot use soap and water, use hand sanitizer.  ? Change your bandage as told by your doctor.  ? Leave stitches (sutures), skin glue, or skin tape (adhesive) strips in place. They may need to stay in place for 2 weeks or longer. If tape strips get loose and curl up, you may trim the loose edges. Do not remove tape strips completely unless your doctor says it is okay   Check your cut area every day for signs of infection. Check for:  ? More redness, swelling, or pain.  ? More fluid or blood.  ? Warmth.  ? Pus or a bad smell.  Bathing   Do not take baths, swim, or use a hot tub until your doctor says it is okay. Ask your doctor if you can take showers. You may only be allowed to take sponge baths for bathing.   Do not get your cut area wet during the 24 hours after the procedure, or as long as told by your doctor.   When you can shower, do not rub the cut area. Gently pat it dry.  General instructions   Avoid heavy lifting, contact sports, biking, or swimming until your doctor says it is okay.   Do not have sex until your doctor says it is okay.   Take or apply over-the-counter and prescription medicines only as told by your doctor.   Keep all follow-up visits as told by your doctor. This is important.  Contact a doctor if:   Medicine does not help your pain.   You have more redness, swelling, or pain around your cut.   You have more fluid or blood coming from your cut.   Your cut feels warm to the touch.   You have pus or a bad  smell coming from your cut.   You have a fever.  Get help right away if:   You cannot pee (urinate).   It hurts to pee.   Your pain is not helped by medicines.   There is redness, swelling, and soreness that spreads up the shaft of your penis, your thighs, or your lower belly (abdomen).   You have bleeding that does not stop when you press on it.  Summary   Follow instructions from your doctor about how to take care of your cut from surgery (incision).   Check your cut area every day for signs of infection.   Do not have sex until your doctor says it is okay.  This information is not intended to replace advice given to you by your health care provider. Make sure you discuss any questions you have with your health care provider.  Document Released: 09/04/2007 Document Revised: 03/26/2016 Document Reviewed: 03/26/2016  Elsevier Interactive Patient Education  2017 [employer]

## 2017-02-13 NOTE — Anesthesia Preprocedure Evaluation (Signed)
Anesthesia Evaluation  Patient identified by MRN, date of birth, ID band Patient awake    Reviewed: Allergy & Precautions, NPO status , Patient's Chart, lab work & pertinent test results  Airway Mallampati: II  TM Distance: >3 FB Neck ROM: Full    Dental  (+) Teeth Intact, Dental Advisory Given   Pulmonary neg pulmonary ROS,    Pulmonary exam normal breath sounds clear to auscultation       Cardiovascular negative cardio ROS Normal cardiovascular exam Rhythm:Regular Rate:Normal     Neuro/Psych negative neurological ROS     GI/Hepatic negative GI ROS, Neg liver ROS,   Endo/Other  Obesity   Renal/GU negative Renal ROS     Musculoskeletal negative musculoskeletal ROS (+)   Abdominal   Peds  Hematology negative hematology ROS (+)   Anesthesia Other Findings Day of surgery medications reviewed with the patient.  Reproductive/Obstetrics                             Anesthesia Physical Anesthesia Plan  ASA: II  Anesthesia Plan: General   Post-op Pain Management:    Induction: Intravenous  PONV Risk Score and Plan: 3 and Ondansetron, Dexamethasone and Midazolam  Airway Management Planned: LMA  Additional Equipment:   Intra-op Plan:   Post-operative Plan: Extubation in OR  Informed Consent: I have reviewed the patients History and Physical, chart, labs and discussed the procedure including the risks, benefits and alternatives for the proposed anesthesia with the patient or authorized representative who has indicated his/her understanding and acceptance.   Dental advisory given  Plan Discussed with: CRNA  Anesthesia Plan Comments: (Risks/benefits of general anesthesia discussed with patient including risk of damage to teeth, lips, gum, and tongue, nausea/vomiting, allergic reactions to medications, and the possibility of heart attack, stroke and death.  All patient questions  answered.  Patient wishes to proceed.)        Anesthesia Quick Evaluation

## 2017-02-13 NOTE — Op Note (Signed)
Preoperative diagnosis: phimosis and desires sterilization  Postoperative diagnosis: Same  Procedure: circumcision  Attending: Wilkie AyePatrick Naod Sweetland, MD  Anesthesia: general  History of blood loss: Minimal  Antibiotics: ancef  Drains: none  Specimens: foreskin  Findings: dense phimotic ring. No masses/lesions on glans or foreskin. Bilateral vas deferens ligated  Indications: Patient is a 36 year old male with a history of phimosis, difficulty urinating, and recurrent balanoprothitis. He also desires sterilization. After discussing treatment options he decided to proceed with circumcision and vasectomy  Procedure in detail: Prior to procedure consent was obtained.  Patient was brought to the operating room and a brief timeout was done to ensure correct patient, correct procedure, correct site.  General anesthesia was administered and patient was placed in supine position.  His genitalia and abdomen was then prepped and draped in usual sterile fashion.  The phimotic ring was incised sharply and the foreskin was then retracted. An circumferential incision was made 1.5cm below the coronal ridge. We then pulled the foreskin over the glans and made a separate circumferential incision at the level of the coronal ridge. We then sharply incised the foreskin and then freed it from the dartos using electrocautery. We then sent the foreskin for pathology. Individual bleeders were then cauterized and good hemostasis was noted. We then reapproximated the penile skin to the subcoronal incision. We used interrupted 3-0 vicryl to close the incision. Once this was complete we applied a gauze bandage.  We then turned our attention to the vasectomy. A 0.5 cm incision was made in the left hemiscrotum.  The vas was then brought to the level of the incision.  Then using a vas grasper we isolated the vas deferens.  We then used electrocautery to free the vas from the perivasal tissue.  Once the vas was isolated we then  placed a clamp on either side of the vas.  Then sharply incised the vas and removed a 1 cm section of vas deferens.  We then cauterized the ends of the vein as and then ligated the vessels with 0 silk suture.  We then placed the individual limbs of vas deferens and separate compartments.  We closed the overlying skin with 3-0 chromic in interrupted fashion.  A similar technique was used on the left side to isolate the left vas.  Left vas was ligated in a similar fashion.  Good hemostasis was also noted in the left hemiscrotum.  We then returned the ends of the vas to separate compartments.  We then closed the overlying skin with 3-0 chromic in an interrupted fashion. This then concluded the procedure which was well tolerated by the patient.  Complications: None  Condition: Stable, extubated, transferred to PACU.  Plan: Patient is to be discharged home.  He is to followup in 2 weeks for a wound check. 8 weeks with a semen sample

## 2017-02-13 NOTE — H&P (Signed)
Urology Admission H&P  Chief Complaint: phimosis and esires sterilization  History of Present Illness: Mr Shaun Holder is a 36yo with a hx of phimosis and who desires sterilization  Past Medical History:  Diagnosis Date  . History of kidney stones    03/ 2018  PASSED SPONTANEOUSLY  . Phimosis   . Wears contact lenses    Past Surgical History:  Procedure Laterality Date  . WISDOM TOOTH EXTRACTION  age 36    Home Medications:  Current Facility-Administered Medications  Medication Dose Route Frequency Provider Last Rate Last Dose  . ceFAZolin (ANCEF) IVPB 2g/100 mL premix  2 g Intravenous 30 min Pre-Op Ronne BinningMcKenzie, Mardene CelestePatrick L, MD      . lactated ringers infusion   Intravenous Continuous Lewie LoronGermeroth, John, MD 50 mL/hr at 02/13/17 1213     Allergies: No Known Allergies  Family History  Problem Relation Age of Onset  . Leukemia Paternal Grandfather   . Heart failure Paternal Grandfather   . Prostate cancer Neg Hx   . Bladder Cancer Neg Hx   . Kidney cancer Neg Hx    Social History:  reports that  has never smoked. he has never used smokeless tobacco. He reports that he does not drink alcohol or use drugs.  Review of Systems  All other systems reviewed and are negative.   Physical Exam:  Vital signs in last 24 hours: Temp:  [98 F (36.7 C)] 98 F (36.7 C) (11/15 1120) Pulse Rate:  [78] 78 (11/15 1120) Resp:  [16] 16 (11/15 1120) BP: (149)/(90) 149/90 (11/15 1120) SpO2:  [100 %] 100 % (11/15 1120) Weight:  [101.2 kg (223 lb)] 101.2 kg (223 lb) (11/15 1120) Physical Exam  Constitutional: He is oriented to person, place, and time. He appears well-developed and well-nourished.  HENT:  Head: Normocephalic and atraumatic.  Eyes: EOM are normal. Pupils are equal, round, and reactive to light.  Neck: Normal range of motion. No thyromegaly present.  Cardiovascular: Normal rate and regular rhythm.  Respiratory: Effort normal. No respiratory distress.  GI: Soft. He exhibits no  distension.  Musculoskeletal: Normal range of motion. He exhibits no edema.  Neurological: He is alert and oriented to person, place, and time.  Skin: Skin is warm and dry.  Psychiatric: He has a normal mood and affect. His behavior is normal. Judgment and thought content normal.    Laboratory Data:  Results for orders placed or performed during the hospital encounter of 02/13/17 (from the past 24 hour(s))  Hemoglobin-hemacue, POC     Status: None   Collection Time: 02/13/17 12:08 PM  Result Value Ref Range   Hemoglobin 15.1 13.0 - 17.0 g/dL   No results found for this or any previous visit (from the past 240 hour(s)). Creatinine: No results for input(s): CREATININE in the last 168 hours. Baseline Creatinine: unknwon  Impression/Assessment:  36yo with phimosis and desires sterilization  Plan:  The risks/benefits/alternatives to circumcision and vasectomy was explained to the patient and he understands and wishes to proceed with surgery  Wilkie Ayeatrick Keiffer Piper 02/13/2017, 1:11 PM

## 2017-02-13 NOTE — Anesthesia Procedure Notes (Signed)
Procedure Name: LMA Insertion Date/Time: 02/13/2017 1:21 PM Performed by: Cecile Hearingurk, Stephen Edward, MD Pre-anesthesia Checklist: Patient identified, Emergency Drugs available, Suction available and Patient being monitored Patient Re-evaluated:Patient Re-evaluated prior to induction Oxygen Delivery Method: Circle system utilized Preoxygenation: Pre-oxygenation with 100% oxygen Induction Type: IV induction Ventilation: Mask ventilation without difficulty LMA: LMA inserted LMA Size: 5.0 Number of attempts: 1 Airway Equipment and Method: Bite block Placement Confirmation: positive ETCO2 Tube secured with: Tape Dental Injury: Teeth and Oropharynx as per pre-operative assessment

## 2017-02-14 ENCOUNTER — Encounter (HOSPITAL_BASED_OUTPATIENT_CLINIC_OR_DEPARTMENT_OTHER): Payer: Self-pay | Admitting: Urology

## 2017-08-13 ENCOUNTER — Emergency Department: Payer: Managed Care, Other (non HMO)

## 2017-08-13 ENCOUNTER — Emergency Department
Admission: EM | Admit: 2017-08-13 | Discharge: 2017-08-13 | Disposition: A | Discharge status: Home / Self Care | Payer: Managed Care, Other (non HMO) | Attending: Emergency Medicine | Admitting: Emergency Medicine

## 2017-08-13 ENCOUNTER — Encounter: Payer: Self-pay | Admitting: Emergency Medicine

## 2017-08-13 DIAGNOSIS — N201 Calculus of ureter: Secondary | ICD-10-CM

## 2017-08-13 DIAGNOSIS — R1031 Right lower quadrant pain: Secondary | ICD-10-CM | POA: Diagnosis present

## 2017-08-13 DIAGNOSIS — Z79899 Other long term (current) drug therapy: Secondary | ICD-10-CM | POA: Insufficient documentation

## 2017-08-13 LAB — URINALYSIS, COMPLETE (UACMP) WITH MICROSCOPIC
BILIRUBIN URINE: NEGATIVE
Bacteria, UA: NONE SEEN
GLUCOSE, UA: NEGATIVE mg/dL
KETONES UR: NEGATIVE mg/dL
LEUKOCYTES UA: NEGATIVE
NITRITE: NEGATIVE
PH: 6 (ref 5.0–8.0)
Protein, ur: NEGATIVE mg/dL
Specific Gravity, Urine: 1.013 (ref 1.005–1.030)
Squamous Epithelial / LPF: NONE SEEN (ref 0–5)
WBC, UA: NONE SEEN WBC/hpf (ref 0–5)

## 2017-08-13 LAB — BASIC METABOLIC PANEL
Anion gap: 10 (ref 5–15)
BUN: 15 mg/dL (ref 6–20)
CHLORIDE: 100 mmol/L — AB (ref 101–111)
CO2: 28 mmol/L (ref 22–32)
Calcium: 9.3 mg/dL (ref 8.9–10.3)
Creatinine, Ser: 1.45 mg/dL — ABNORMAL HIGH (ref 0.61–1.24)
GFR calc Af Amer: >60 mL/min (ref >60)
GFR calc non Af Amer: >60 mL/min (ref >60)
Glucose, Bld: 86 mg/dL (ref 65–99)
POTASSIUM: 3.8 mmol/L (ref 3.5–5.1)
Sodium: 138 mmol/L (ref 135–145)

## 2017-08-13 MED ORDER — OXYCODONE-ACETAMINOPHEN 5-325 MG PO TABS: 1.0000 | ORAL_TABLET | Freq: Four times a day (QID) | ORAL | 0 refills | Status: AC | PRN

## 2017-08-13 MED ORDER — SODIUM CHLORIDE 0.9 % IV BOLUS
1000.0000 mL | Freq: Once | INTRAVENOUS | Status: AC
  Administered 2017-08-13: 1000 mL via INTRAVENOUS

## 2017-08-13 MED ORDER — HYDROMORPHONE HCL 1 MG/ML IJ SOLN
1.0000 mg | Freq: Once | INTRAMUSCULAR | Status: AC
  Administered 2017-08-13: 1 mg via INTRAVENOUS
  Filled 2017-08-13: qty 1

## 2017-08-13 MED ORDER — KETOROLAC TROMETHAMINE 30 MG/ML IJ SOLN
15.0000 mg | Freq: Once | INTRAMUSCULAR | Status: AC
  Administered 2017-08-13: 15 mg via INTRAVENOUS
  Filled 2017-08-13: qty 1

## 2017-08-13 NOTE — ED Triage Notes (Signed)
Pt reports lower right back pain that radiates around and to his groin. Pt states hx of kidney stones and feels similar.

## 2017-08-13 NOTE — ED Provider Notes (Signed)
[facility] Emergency Department Provider Note ____________________________________________   First MD Initiated Contact with Patient 08/13/17 1712     (approximate)  I have reviewed the triage vital signs and the nursing notes.   HISTORY  Chief Complaint Flank Pain    HPI Shaun Holder is a 37 y.o. male with PMH as noted below who presents with right flank and right lower quadrant abdominal pain over the last day, acute onset, persistent course, associated with nausea and one episode of vomiting.  He states it feels similar to pain from prior kidney stones.  He denies any changes in his urination, or any fevers.   Past Medical History:  Diagnosis Date  . History of kidney stones    03/ 2018  PASSED SPONTANEOUSLY  . Phimosis   . Wears contact lenses     Patient Active Problem List   Diagnosis Date Noted  . Acute renal failure (ARF) (HCC) 08/24/2016    Past Surgical History:  Procedure Laterality Date  . CIRCUMCISION N/A 02/13/2017   Procedure: CIRCUMCISION ADULT;  Surgeon: Malen Gauze, MD;  Location: [facility];  Service: Urology;  Laterality: N/A;  . VASECTOMY Bilateral 02/13/2017   Procedure: VASECTOMY;  Surgeon: Malen Gauze, MD;  Location: [facility];  Service: Urology;  Laterality: Bilateral;  . WISDOM TOOTH EXTRACTION  age 37    Prior to Admission medications   Medication Sig Start Date End Date Taking? Authorizing Provider  diazepam (VALIUM) 5 MG tablet Take 1 tablet (5 mg total) at bedtime by mouth. 02/13/17 02/13/18  McKenzie, Mardene Celeste, MD  oxyCODONE-acetaminophen (PERCOCET) 5-325 MG tablet Take 1 tablet by mouth every 6 (six) hours as needed for up to 3 days for severe pain. 08/13/17 08/16/17  Dionne Bucy, MD    Allergies Patient has no known allergies.  Family History  Problem Relation Age of Onset  . Leukemia Paternal Grandfather   . Heart failure Paternal Grandfather     . Prostate cancer Neg Hx   . Bladder Cancer Neg Hx   . Kidney cancer Neg Hx     Social History Social History   Tobacco Use  . Smoking status: Never Smoker  . Smokeless tobacco: Never Used  Substance Use Topics  . Alcohol use: No  . Drug use: No    Review of Systems  Constitutional: No fever. Eyes: No redness. ENT: No sore throat. Cardiovascular: Denies chest pain. Respiratory: Denies shortness of breath. Gastrointestinal: Positive for vomiting.  Genitourinary: Negative for dysuria or hematuria.  Musculoskeletal: Negative for back pain. Skin: Negative for rash. Neurological: Negative for headache.   ____________________________________________   PHYSICAL EXAM:  VITAL SIGNS: ED Triage Vitals  Enc Vitals Group     BP 08/13/17 1426 (!) 138/99     Pulse Rate 08/13/17 1426 (!) 114     Resp 08/13/17 1426 20     Temp 08/13/17 1426 98.4 F (36.9 C)     Temp Source 08/13/17 1426 Oral     SpO2 08/13/17 1426 100 %     Weight 08/13/17 1427 227 lb (103 kg)     Height 08/13/17 1427  (1.778 m)     Head Circumference --      Peak Flow --      Pain Score 08/13/17 1426 7     Pain Loc --      Pain Edu? --      Excl. in GC? --     Constitutional: Alert  and oriented.  Slightly uncomfortable appearing but in no acute distress. Eyes: Conjunctivae are normal.  Head: Atraumatic. Nose: No congestion/rhinnorhea. Mouth/Throat: Mucous membranes are slightly dry.   Neck: Normal range of motion.  Cardiovascular:  Good peripheral circulation. Respiratory: Normal respiratory effort.  Gastrointestinal: Soft and nontender. No distention.  Genitourinary: No flank tenderness. Musculoskeletal:  Extremities warm and well perfused.  Neurologic:  Normal speech and language. No gross focal neurologic deficits are appreciated.  Skin:  Skin is warm and dry. No rash noted. Psychiatric: Mood and affect are normal. Speech and behavior are  normal.  ____________________________________________   LABS (all labs ordered are listed, but only abnormal results are displayed)  Labs Reviewed  URINALYSIS, COMPLETE (UACMP) WITH MICROSCOPIC - Abnormal; Notable for the following components:      Result Value   Color, Urine STRAW (*)    APPearance CLEAR (*)    Hgb urine dipstick SMALL (*)    All other components within normal limits  BASIC METABOLIC PANEL - Abnormal; Notable for the following components:   Chloride 100 (*)    Creatinine, Ser 1.45 (*)    All other components within normal limits   ____________________________________________  EKG   ____________________________________________  RADIOLOGY  CT abdomen: 4 mm right distal ureteral stone  ____________________________________________   PROCEDURES  Procedure(s) performed: No  Procedures  Critical Care performed: No ____________________________________________   INITIAL IMPRESSION / ASSESSMENT AND PLAN / ED COURSE  Pertinent labs & imaging results that were available during my care of the patient were reviewed by me and considered in my medical decision making (see chart for details).  37 year old male with PMH as noted above including prior history of ureteral stones presents with right flank and right lower quadrant abdominal pain over the last day, feeling similar to prior stones.  However, he states that the presence of the pain in the right lower quadrant is somewhat atypical for him.  No urinary symptoms.  On exam, the patient is relatively well-appearing.  Vital signs are normal except for hypertension, and the remainder of the exam is as described above.  There is no focal abdominal tenderness.  I reviewed past medical records in Epic; the patient was admitted approximately 1 year ago for renal insufficiency in the context of ureteral stones and NSAID use.  Presentation is consistent with ureteral stone.  No clinical evidence for pyelonephritis.   I have a low suspicion for appendicitis or other GI cause given the lack of tenderness and the overall similar to prior ureteral stone presentations.  Plan: Basic labs, UA, IV fluids, analgesia, and CT renal stone study to rule out obstructing stone or other cause of patient's pain.    ----------------------------------------- 7:01 PM on 08/13/2017 -----------------------------------------  Lab work-up is unremarkable.  Patient's creatinine is improved from prior.  CT shows 4 mm distal right ureteral stone.  Given the size and location, this likely should pass on its own.  Patient's pain has been well controlled since his first dose of analgesics.  He feels well and would like to go home.  Return precautions given, and he expresses understanding.   ____________________________________________   FINAL CLINICAL IMPRESSION(S) / ED DIAGNOSES  Final diagnoses:  Ureterolithiasis      NEW MEDICATIONS STARTED DURING THIS VISIT:  New Prescriptions   OXYCODONE-ACETAMINOPHEN (PERCOCET) 5-325 MG TABLET    Take 1 tablet by mouth every 6 (six) hours as needed for up to 3 days for severe pain.     Note:  This document  was prepared using Conservation officer, historic buildings and may include unintentional dictation errors.    Dionne Bucy, MD 08/13/17 1901

## 2017-08-28 ENCOUNTER — Ambulatory Visit: Payer: Managed Care, Other (non HMO)

## 2019-03-13 ENCOUNTER — Emergency Department: Payer: Managed Care, Other (non HMO)

## 2019-03-13 ENCOUNTER — Emergency Department
Admission: EM | Admit: 2019-03-13 | Discharge: 2019-03-13 | Disposition: A | Discharge status: Home / Self Care | Payer: Managed Care, Other (non HMO) | Attending: Emergency Medicine | Admitting: Emergency Medicine

## 2019-03-13 ENCOUNTER — Other Ambulatory Visit: Payer: Self-pay

## 2019-03-13 ENCOUNTER — Encounter: Payer: Self-pay | Admitting: Emergency Medicine

## 2019-03-13 DIAGNOSIS — S39012A Strain of muscle, fascia and tendon of lower back, initial encounter: Secondary | ICD-10-CM | POA: Insufficient documentation

## 2019-03-13 DIAGNOSIS — Y999 Unspecified external cause status: Secondary | ICD-10-CM | POA: Diagnosis not present

## 2019-03-13 DIAGNOSIS — Y929 Unspecified place or not applicable: Secondary | ICD-10-CM | POA: Insufficient documentation

## 2019-03-13 DIAGNOSIS — S3992XA Unspecified injury of lower back, initial encounter: Secondary | ICD-10-CM | POA: Diagnosis present

## 2019-03-13 DIAGNOSIS — X500XXA Overexertion from strenuous movement or load, initial encounter: Secondary | ICD-10-CM | POA: Diagnosis not present

## 2019-03-13 DIAGNOSIS — Y93B9 Activity, other involving muscle strengthening exercises: Secondary | ICD-10-CM | POA: Diagnosis not present

## 2019-03-13 MED ORDER — HYDROMORPHONE HCL 1 MG/ML IJ SOLN
1.0000 mg | Freq: Once | INTRAMUSCULAR | Status: AC
  Administered 2019-03-13: 11:00:00 1 mg via INTRAMUSCULAR
  Filled 2019-03-13: qty 1

## 2019-03-13 MED ORDER — CYCLOBENZAPRINE HCL 10 MG PO TABS: 10.0000 mg | ORAL_TABLET | Freq: Three times a day (TID) | ORAL | 0 refills | Status: DC | PRN

## 2019-03-13 MED ORDER — OXYCODONE-ACETAMINOPHEN 7.5-325 MG PO TABS: 1.0000 | ORAL_TABLET | Freq: Four times a day (QID) | ORAL | 0 refills | Status: AC | PRN

## 2019-03-13 MED ORDER — KETOROLAC TROMETHAMINE 60 MG/2ML IM SOLN
60.0000 mg | Freq: Once | INTRAMUSCULAR | Status: AC
  Administered 2019-03-13: 11:00:00 60 mg via INTRAMUSCULAR
  Filled 2019-03-13: qty 2

## 2019-03-13 MED ORDER — KETOROLAC TROMETHAMINE 10 MG PO TABS: 10.0000 mg | ORAL_TABLET | Freq: Four times a day (QID) | ORAL | 0 refills | Status: DC | PRN

## 2019-03-13 MED ORDER — ORPHENADRINE CITRATE 30 MG/ML IJ SOLN
60.0000 mg | Freq: Two times a day (BID) | INTRAMUSCULAR | Status: DC
  Administered 2019-03-13: 11:00:00 60 mg via INTRAMUSCULAR
  Filled 2019-03-13: qty 2

## 2019-03-13 NOTE — ED Triage Notes (Signed)
Presents with lower back pain   States he was exercising on Thursday  Developed lower back pain/stiffness yesterday  States pain is moving into right leg this am

## 2019-03-13 NOTE — Discharge Instructions (Signed)
Follow discharge care instruction.  Incidental finding of  nonobstructive bilateral kidney stones.  Be advised medication may cause drowsiness.  Do not take while at work or while driving.

## 2019-03-13 NOTE — ED Provider Notes (Signed)
[facility] Emergency Department Provider Note   ____________________________________________   First MD Initiated Contact with Patient 03/13/19 1028     (approximate)  I have reviewed the triage vital signs and the nursing notes.   HISTORY  Chief Complaint Back Pain    HPI Shaun Holder is a 38 y.o. male patient complain of acute low back pain with radicular component to the right lower extremity.  Onset of complaint began 2 days ago after increased physical activities and exercise machine.  Patient denies bladder bowel dysfunction.  Patient state pain feels like there is a "tightness" in his lower back.  Patient rates pain as a 7/10.  No palliative measure for complaint.  Similar episode 1 year ago but not as severe as the one today.         Past Medical History:  Diagnosis Date  . History of kidney stones    03/ 2018  PASSED SPONTANEOUSLY  . Phimosis   . Wears contact lenses     Patient Active Problem List   Diagnosis Date Noted  . Acute renal failure (ARF) (HCC) 08/24/2016    Past Surgical History:  Procedure Laterality Date  . CIRCUMCISION N/A 02/13/2017   Procedure: CIRCUMCISION ADULT;  Surgeon: Malen GauzeMcKenzie, Patrick L, MD;  [facility];  Service: Urology;  Laterality: N/A;  . VASECTOMY Bilateral 02/13/2017   Procedure: VASECTOMY;  Surgeon: Malen GauzeMcKenzie, Patrick L, MD;  [facility];  Service: Urology;  Laterality: Bilateral;  . WISDOM TOOTH EXTRACTION  age 38    Prior to Admission medications   Medication Sig Start Date End Date Taking? Authorizing Provider  cyclobenzaprine (FLEXERIL) 10 MG tablet Take 1 tablet (10 mg total) by mouth 3 (three) times daily as needed. 03/13/19   Joni ReiningSmith, Yulianna Folse K, PA-C  ketorolac (TORADOL) 10 MG tablet Take 1 tablet (10 mg total) by mouth every 6 (six) hours as needed. 03/13/19   Joni ReiningSmith, Marycarmen Hagey K, PA-C  oxyCODONE-acetaminophen (PERCOCET) 7.5-325 MG tablet Take 1  tablet by mouth every 6 (six) hours as needed for up to 3 days. 03/13/19 03/16/19  Joni ReiningSmith, Emika Tiano K, PA-C    Allergies Patient has no known allergies.  Family History  Problem Relation Age of Onset  . Leukemia Paternal Grandfather   . Heart failure Paternal Grandfather   . Prostate cancer Neg Hx   . Bladder Cancer Neg Hx   . Kidney cancer Neg Hx     Social History Social History   Tobacco Use  . Smoking status: Never Smoker  . Smokeless tobacco: Never Used  Substance Use Topics  . Alcohol use: No  . Drug use: No    Review of Systems Constitutional: No fever/chills Eyes: No visual changes. ENT: No sore throat. Cardiovascular: Denies chest pain. Respiratory: Denies shortness of breath. Gastrointestinal: No abdominal pain.  No nausea, no vomiting.  No diarrhea.  No constipation. Genitourinary: Negative for dysuria. Musculoskeletal: Positive for back pain. Skin: Negative for rash. Neurological: Negative for headaches, focal weakness or numbness.   ____________________________________________   PHYSICAL EXAM:  VITAL SIGNS: ED Triage Vitals  Enc Vitals Group     BP 03/13/19 1025 (!) 131/94     Pulse Rate 03/13/19 1025 (!) 105     Resp 03/13/19 1025 18     Temp 03/13/19 1025 98 F (36.7 C)     Temp Source 03/13/19 1025 Oral     SpO2 03/13/19 1025 99 %     Weight 03/13/19 1031 226  lb (102.5 kg)     Height 03/13/19 1031 5\' 10"  (1.778 m)     Head Circumference --      Peak Flow --      Pain Score 03/13/19 1031 7     Pain Loc --      Pain Edu? --      Excl. in GC? --     Constitutional: Alert and oriented.  Moderate distress laying on left side.   Cardiovascular: Normal rate, regular rhythm. Grossly normal heart sounds.  Good peripheral circulation. Respiratory: Normal respiratory effort.  No retractions. Lungs CTAB. Gastrointestinal: Soft and nontender. No distention. No abdominal bruits. No CVA tenderness. Genitourinary: Deferred Musculoskeletal: No obvious  spinal deformity.  Patient is moderate guarding palpation of L3-S1.  No lower extremity tenderness nor edema.  No joint effusions. Neurologic:  Normal speech and language. No gross focal neurologic deficits are appreciated. No gait instability. Skin:  Skin is warm, dry and intact. No rash noted. Psychiatric: Mood and affect are normal. Speech and behavior are normal.  ____________________________________________   LABS (all labs ordered are listed, but only abnormal results are displayed)  Labs Reviewed - No data to display ____________________________________________  EKG   ____________________________________________  RADIOLOGY  ED MD interpretation:    Official radiology report(s): DG Lumbar Spine 2-3 Views  Result Date: 03/13/2019 CLINICAL DATA:  Back pain X 2 days after agressive cardio exercise. No injury, pain lower back into the front of right thigh to knee EXAM: LUMBAR SPINE - 2-3 VIEW COMPARISON:  CT urogram 10/20/2018 FINDINGS: Normal alignment. Vertebral body heights are maintained without evidence of fracture. No significant degenerative change. No focal bony lesion. SI joints are open. Nonobstructive bowel gas pattern. Tiny radiopaque densities projecting over the bilateral kidneys likely representing renal calculi. IMPRESSION: 1.  Negative radiographs of the lumbar spine. 2. Tiny radiopaque densities projecting over the bilateral kidneys likely representing renal calculi. Electronically Signed   By: 10/22/2018 M.D.   On: 03/13/2019 11:34    ____________________________________________   PROCEDURES  Procedure(s) performed (including Critical Care):  Procedures   ____________________________________________   INITIAL IMPRESSION / ASSESSMENT AND PLAN / ED COURSE  As part of my medical decision making, I reviewed the following data within the electronic MEDICAL RECORD NUMBER    Patient presents with 2 days of increasing low back pain second to a intense  physical training episode.  Patient physical exam consistent with muscle  strain.  Discussed x-ray findings with patient which show confined to nonobstructive bilateral kidney stones.  Patient given discharge care instructions and states he has an appointment for urology next week.  Advised on the job effects of medication and did not take while working or driving.  Follow-up with family doctor as needed.    Nilson Tabora was evaluated in Emergency Department on 03/13/2019 for the symptoms described in the history of present illness. He was evaluated in the context of the global COVID-19 pandemic, which necessitated consideration that the patient might be at risk for infection with the SARS-CoV-2 virus that causes COVID-19. Institutional protocols and algorithms that pertain to the evaluation of patients at risk for COVID-19 are in a state of rapid change based on information released by regulatory bodies including the CDC and federal and state organizations. These policies and algorithms were followed during the patient's care in the ED.       ____________________________________________   FINAL CLINICAL IMPRESSION(S) / ED DIAGNOSES  Final diagnoses:  Strain of lumbar region, initial encounter  ED Discharge Orders         Ordered    oxyCODONE-acetaminophen (PERCOCET) 7.5-325 MG tablet  Every 6 hours PRN     03/13/19 1151    cyclobenzaprine (FLEXERIL) 10 MG tablet  3 times daily PRN     03/13/19 1151    ketorolac (TORADOL) 10 MG tablet  Every 6 hours PRN     03/13/19 1151           Note:  This document was prepared using Dragon voice recognition software and may include unintentional dictation errors.    Sable Feil, PA-C 03/13/19 1154    Blake Divine, MD 03/15/19 Lurena Nida

## 2020-03-26 IMAGING — CR DG LUMBAR SPINE 2-3V
1 series · 3 of 3 positions shown · non-contrast
Comparison: CT urogram 10/20/2018

CLINICAL DATA: Back pain X 2 days after agressive cardio exercise.
No injury, pain lower back into the front of right thigh to knee

EXAM:
LUMBAR SPINE - 2-3 VIEW

[Series 1: dg lumbar spine 2-3 views · 0.14mm/px · 3 of 3 slices shown]
[im 1/3]
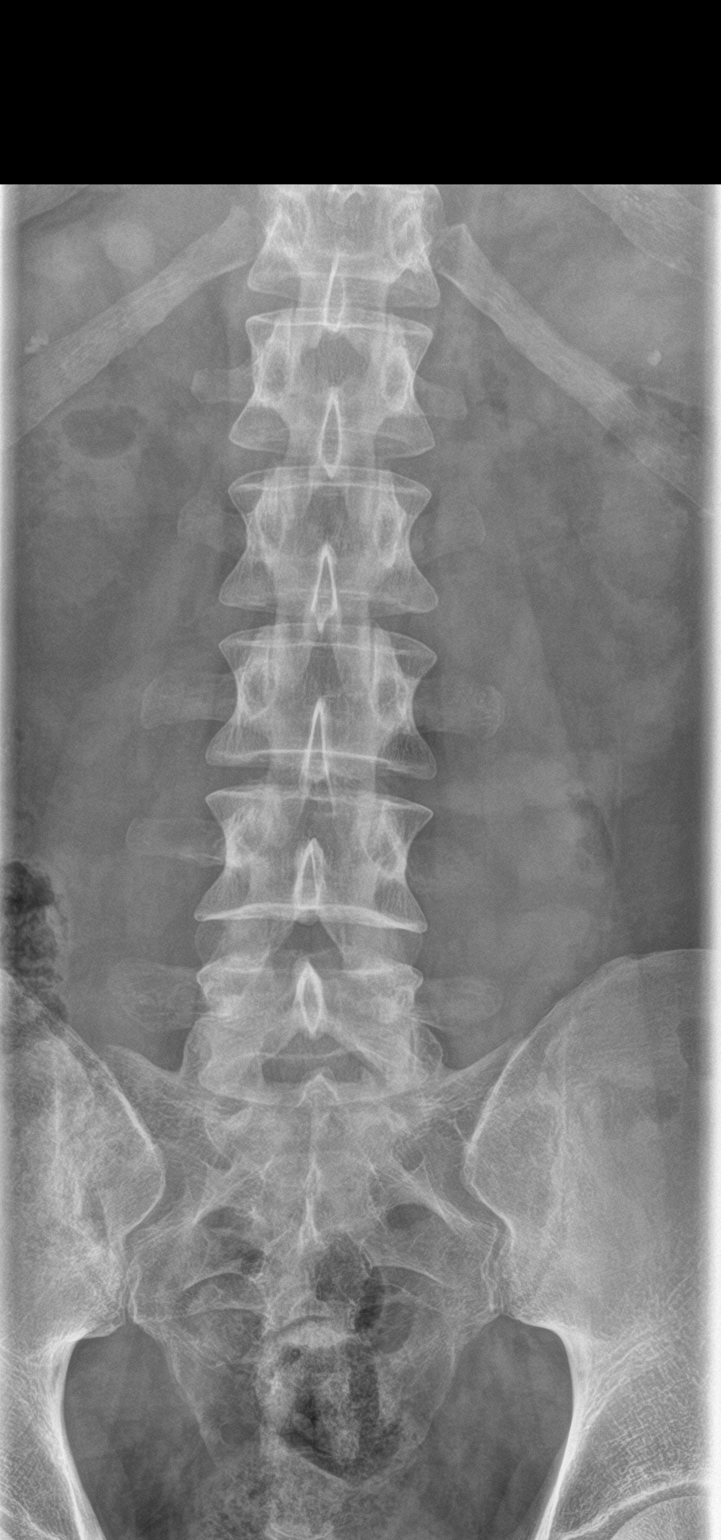
[im 2/3]
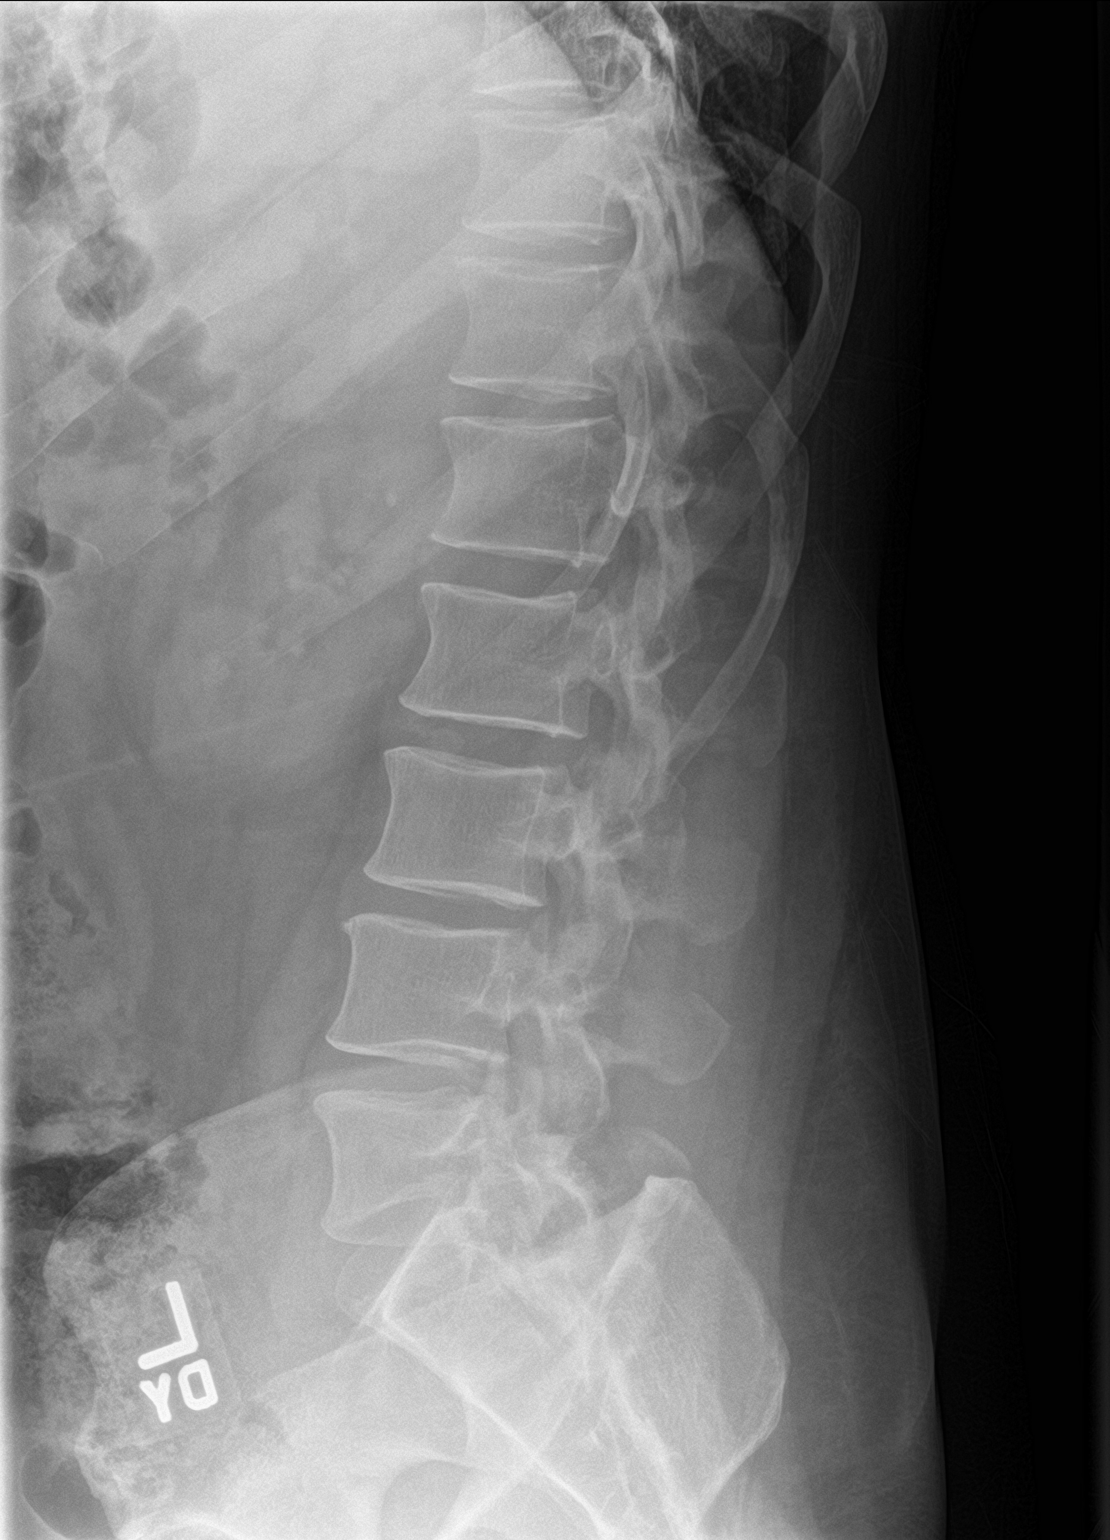
[im 3/3]
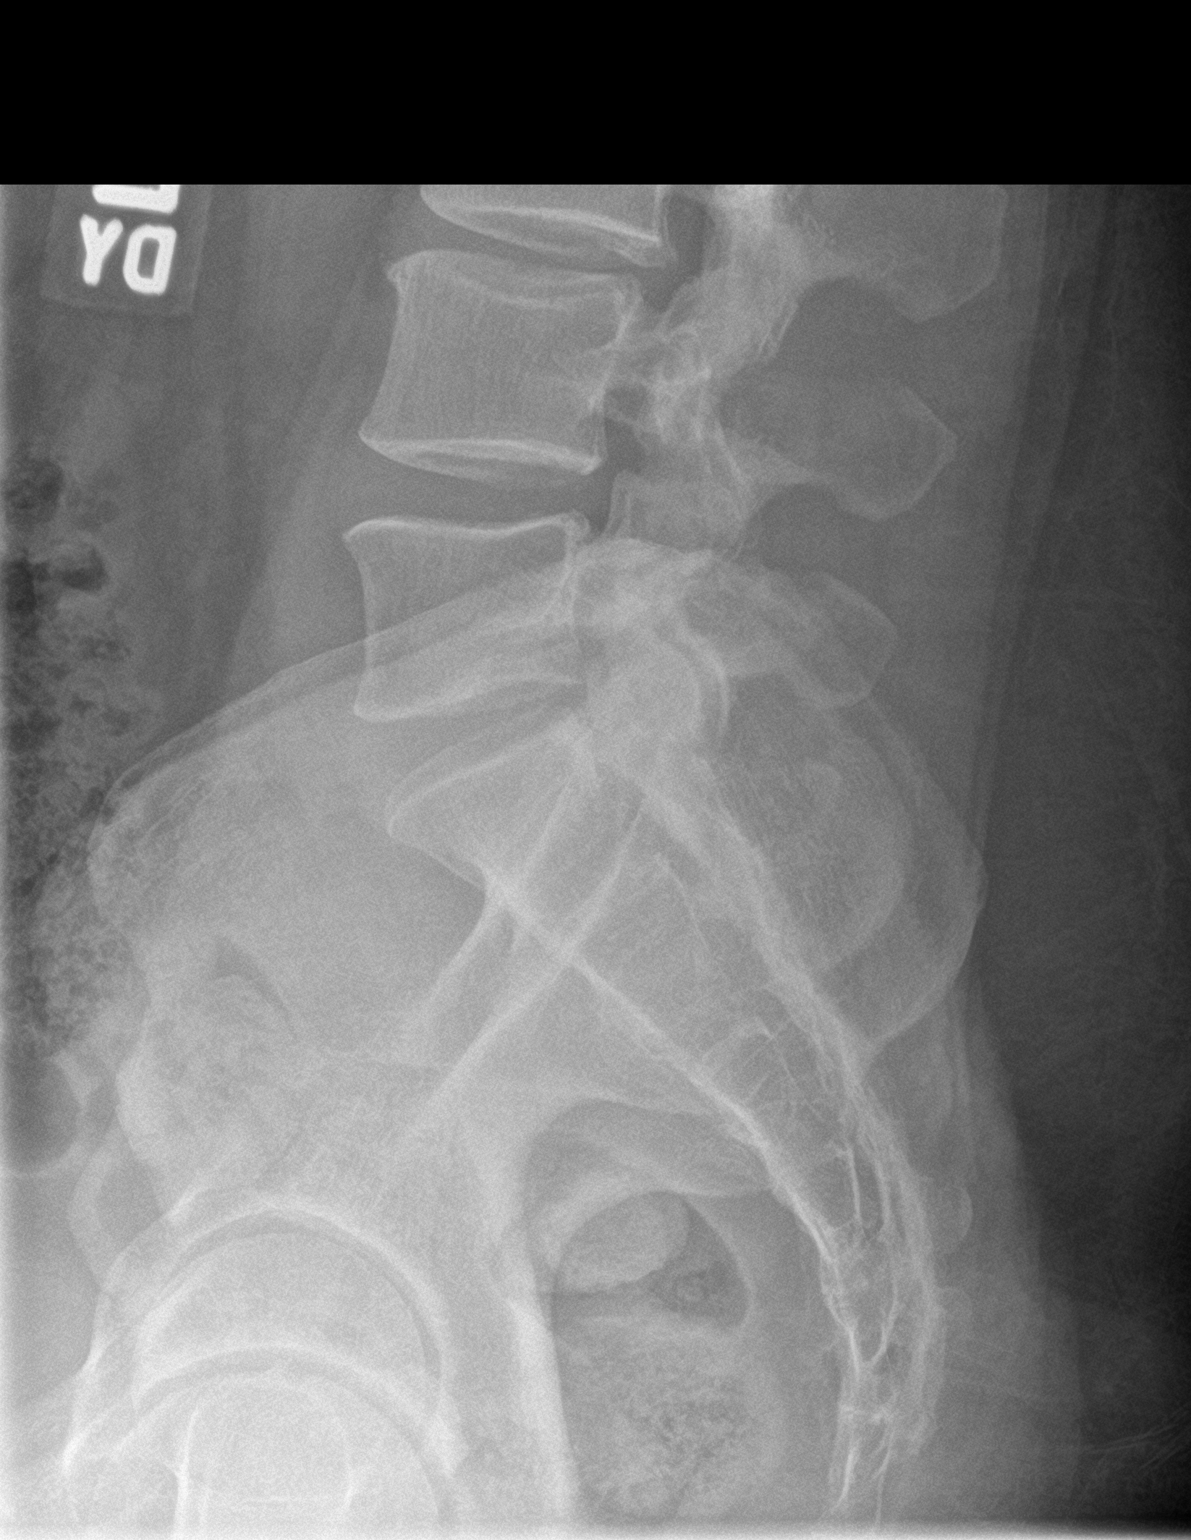

[3 of 3 positions shown; findings below may reference images not displayed]

FINDINGS: Normal alignment. Vertebral body heights are maintained without
evidence of fracture. No significant degenerative change. No focal
bony lesion. SI joints are open. Nonobstructive bowel gas pattern.

Tiny radiopaque densities projecting over the bilateral kidneys
likely representing renal calculi.
IMPRESSION: 1.  Negative radiographs of the lumbar spine.

2. Tiny radiopaque densities projecting over the bilateral kidneys
likely representing renal calculi.

## 2021-09-01 ENCOUNTER — Other Ambulatory Visit: Payer: Self-pay

## 2021-09-01 ENCOUNTER — Emergency Department
Admission: EM | Admit: 2021-09-01 | Discharge: 2021-09-02 | Disposition: A | Discharge status: Home / Self Care | Payer: 59 | Attending: Emergency Medicine | Admitting: Emergency Medicine

## 2021-09-01 DIAGNOSIS — I10 Essential (primary) hypertension: Secondary | ICD-10-CM | POA: Diagnosis not present

## 2021-09-01 DIAGNOSIS — W268XXA Contact with other sharp object(s), not elsewhere classified, initial encounter: Secondary | ICD-10-CM | POA: Insufficient documentation

## 2021-09-01 DIAGNOSIS — S99922A Unspecified injury of left foot, initial encounter: Secondary | ICD-10-CM | POA: Diagnosis present

## 2021-09-01 DIAGNOSIS — Y9389 Activity, other specified: Secondary | ICD-10-CM | POA: Insufficient documentation

## 2021-09-01 DIAGNOSIS — S91312A Laceration without foreign body, left foot, initial encounter: Secondary | ICD-10-CM | POA: Insufficient documentation

## 2021-09-01 MED ORDER — CEFADROXIL 500 MG PO CAPS: 500.0000 mg | ORAL_CAPSULE | Freq: Two times a day (BID) | ORAL | 0 refills | Status: AC

## 2021-09-01 MED ORDER — LIDOCAINE HCL (PF) 1 % IJ SOLN
10.0000 mL | Freq: Once | INTRAMUSCULAR | Status: AC
  Administered 2021-09-01: 10 mL
  Filled 2021-09-01: qty 10

## 2021-09-01 MED ORDER — CEPHALEXIN 500 MG PO CAPS
500.0000 mg | ORAL_CAPSULE | Freq: Once | ORAL | Status: AC
  Administered 2021-09-01: 500 mg via ORAL
  Filled 2021-09-01: qty 1

## 2021-09-01 NOTE — ED Provider Notes (Signed)
[facility] Provider Note    Event Date/Time   First MD Initiated Contact with Patient 09/01/21 2300     (approximate)   History   Extremity Laceration   HPI  Shaun Holder is a 41 y.o. male with no chronic or contributory past medical history who presents for evaluation of a laceration to his left heel.  He was barefoot, washing his boat, when he slipped and sliced the back of his heel on the trailer hitch, which she had just washed.  There is no foreign material on the cutting surface.  He said that initially thought the pain was because he twisted his ankle, but then he realized he lacerated it.  He is able to bear weight, flex and extend his foot, and has no swelling.  Bleeding stopped with pressure.  No numbness nor tingling.  He denies any reduced range of motion.  His last tetanus vaccination was 1 year ago.  He does not have diabetes or any immunocompromising condition.     Physical Exam   Triage Vital Signs: ED Triage Vitals  Enc Vitals Group     BP 09/01/21 1953 (!) 139/96     Pulse Rate 09/01/21 1953 97     Resp 09/01/21 1953 16     Temp 09/01/21 1953 99.4 F (37.4 C)     Temp Source 09/01/21 1953 Oral     SpO2 09/01/21 1953 95 %     Weight 09/01/21 1954 104.3 kg (230 lb)     Height 09/01/21 1954 1.778 m (5\' 10" )     Head Circumference --      Peak Flow --      Pain Score 09/01/21 1953 2     Pain Loc --      Pain Edu? --      Excl. in GC? --     Most recent vital signs: Vitals:   09/01/21 1953  BP: (!) 139/96  Pulse: 97  Resp: 16  Temp: 99.4 F (37.4 C)  SpO2: 95%     General: Awake, no distress.  CV:  Good peripheral perfusion.  Resp:  Normal effort.  Abd:  No distention.  Other:  5-cm laceration to the left heel, essentially linear with some tearing but no need for debridement or revision.  No foreign material palpable nor observable.   ED Results / Procedures / Treatments    RADIOLOGY No indication for emergent  imaging    PROCEDURES:  Critical Care performed: No  ..Laceration Repair  Date/Time: 09/01/2021 11:46 PM Performed by: 11/01/2021, MD Authorized by: Loleta Rose, MD   Consent:    Consent obtained:  Verbal   Consent given by:  Patient   Risks discussed:  Infection, pain, retained foreign body, poor cosmetic result, poor wound healing and need for additional repair Universal protocol:    Patient identity confirmed:  Verbally with patient and arm band Anesthesia:    Anesthesia method:  Local infiltration   Local anesthetic:  Lidocaine 1% w/o epi Laceration details:    Location:  Foot   Foot location:  L heel   Length (cm):  5 Exploration:    Hemostasis achieved with:  Direct pressure   Contaminated: no   Treatment:    Area cleansed with:  Saline   Amount of cleaning:  Extensive   Irrigation solution:  Sterile saline   Visualized foreign bodies/material removed: no     Debridement:  Minimal   Undermining:  None Skin repair:  Repair method:  Sutures   Suture size:  4-0   Suture material:  Prolene   Suture technique:  Running   Number of sutures: 1 running suture with 10 passes. Approximation:    Approximation:  Close Repair type:    Repair type:  Simple Post-procedure details:    Dressing:  Sterile dressing   Procedure completion:  Tolerated well, no immediate complications   MEDICATIONS ORDERED IN ED: Medications  lidocaine (PF) (XYLOCAINE) 1 % injection 10 mL (10 mLs Other Given 09/01/21 2317)  cephALEXin (KEFLEX) capsule 500 mg (500 mg Oral Given 09/01/21 2316)     IMPRESSION / MDM / ASSESSMENT AND PLAN / ED COURSE  I reviewed the triage vital signs and the nursing notes.                              Differential diagnosis includes, but is not limited to, laceration, neurovascular damage, fracture dislocation, ankle sprain.  Patient's presentation is most consistent with acute, uncomplicated illness.  Vital signs are stable other than some slight  hypertension which is likely situational.  Patient is not immunocompromised and is not on blood thinners.  The cutting surface is reportedly clean and uncontaminated having just been washed and he was not wearing shoes.  This is a laceration and not a puncture wound.  He is neurovascularly intact and has full range of motion.  There is no swelling of his ankle or point tenderness on either the medial or lateral malleolus and no tenderness to palpation of the midfoot.  We discussed imaging but agreed that he does not need x-rays at this time.  Repaired laceration as documented above without difficulty.  Gave first dose of cephalexin 500 mg p.o. and cefadroxil as listed below for antibiotic prophylaxis, given the distal location of the patient's wound.  Given usual customary management recommendations and return precautions as well as follow-up recommendations in about 10 to 14 days for suture removal.  He understands and agrees with the plan.       FINAL CLINICAL IMPRESSION(S) / ED DIAGNOSES   Final diagnoses:  Laceration of left heel, initial encounter     Rx / DC Orders   ED Discharge Orders          Ordered    cefadroxil (DURICEF) 500 MG capsule  2 times daily        09/01/21 2349             Note:  This document was prepared using Dragon voice recognition software and may include unintentional dictation errors.   Loleta Rose, MD 09/01/21 [phone]

## 2021-09-01 NOTE — ED Triage Notes (Signed)
Pt presents to ER from home c/o lac to heel of his left foot. Pt states he was washing his boat, and slipped and sliced his foot on the metal trailer.  Lac is appx 2 inches, with bleeding controlled at this time.  Pt A&O x4 at this time in NAD.

## 2021-09-01 NOTE — ED Notes (Signed)
Pt presents with laceration to left heel. States tetanus shot up to date. Bleeding controlled. Mobility/Sensation intact.

## 2021-09-01 NOTE — Discharge Instructions (Signed)
You have been seen in the Emergency Department (ED) today for a laceration (cut).  Please keep the cut clean but do not submerge it in the water.  It has been repaired with staples or sutures that will need to be removed in about 10-14 days. Please follow up with your doctor, an urgent care, or return to the ED for suture removal.    Please take Tylenol (acetaminophen) or Motrin (ibuprofen) as needed for discomfort as written on the box.  Please take the full course of antibiotics prescribed to reduce the probability you will develop an infection.  Please follow up with your doctor as soon as possible regarding today's emergent visit.   Return to the ED or call your doctor if you notice any signs of infection such as fever, increased pain, increased redness, pus, or other symptoms that concern you.

## 2021-09-01 NOTE — ED Notes (Signed)
Suture cart at bedside 

## 2021-09-02 NOTE — ED Notes (Signed)
Discharge instructions were discussed with pt. Pt verbalized understanding with no questions at this time. Printed prescription provided. Pt wheelchair to car. Pt going home with family at bedside

## 2021-09-02 NOTE — ED Notes (Signed)
Laceration wrapped with nonadherent dressing, curlex, and coban.

## 2023-07-06 ENCOUNTER — Other Ambulatory Visit: Payer: Self-pay

## 2023-07-06 ENCOUNTER — Emergency Department
Admission: EM | Admit: 2023-07-06 | Discharge: 2023-07-06 | Disposition: A | Discharge status: Home / Self Care | Attending: Emergency Medicine | Admitting: Emergency Medicine

## 2023-07-06 ENCOUNTER — Emergency Department

## 2023-07-06 DIAGNOSIS — N132 Hydronephrosis with renal and ureteral calculous obstruction: Secondary | ICD-10-CM | POA: Diagnosis not present

## 2023-07-06 DIAGNOSIS — N200 Calculus of kidney: Secondary | ICD-10-CM

## 2023-07-06 DIAGNOSIS — R109 Unspecified abdominal pain: Secondary | ICD-10-CM | POA: Diagnosis present

## 2023-07-06 LAB — URINALYSIS, ROUTINE W REFLEX MICROSCOPIC
Bilirubin Urine: NEGATIVE
Glucose, UA: NEGATIVE mg/dL
Hgb urine dipstick: NEGATIVE
Ketones, ur: NEGATIVE mg/dL
Leukocytes,Ua: NEGATIVE
Nitrite: NEGATIVE
Protein, ur: NEGATIVE mg/dL
Specific Gravity, Urine: 1.023 (ref 1.005–1.030)
pH: 5 (ref 5.0–8.0)

## 2023-07-06 LAB — CBC
HCT: 43.3 % (ref 39.0–52.0)
Hemoglobin: 14.8 g/dL (ref 13.0–17.0)
MCH: 30.9 pg (ref 26.0–34.0)
MCHC: 34.2 g/dL (ref 30.0–36.0)
MCV: 90.4 fL (ref 80.0–100.0)
Platelets: 273 10*3/uL (ref 150–400)
RBC: 4.79 MIL/uL (ref 4.22–5.81)
RDW: 12.1 % (ref 11.5–15.5)
WBC: 9.3 10*3/uL (ref 4.0–10.5)
nRBC: 0 % (ref 0.0–0.2)

## 2023-07-06 LAB — COMPREHENSIVE METABOLIC PANEL WITH GFR
ALT: 54 U/L — ABNORMAL HIGH (ref 0–44)
AST: 35 U/L (ref 15–41)
Albumin: 4.6 g/dL (ref 3.5–5.0)
Alkaline Phosphatase: 77 U/L (ref 38–126)
Anion gap: 9 (ref 5–15)
BUN: 21 mg/dL — ABNORMAL HIGH (ref 6–20)
CO2: 26 mmol/L (ref 22–32)
Calcium: 9.4 mg/dL (ref 8.9–10.3)
Chloride: 105 mmol/L (ref 98–111)
Creatinine, Ser: 1.22 mg/dL (ref 0.61–1.24)
GFR, Estimated: >60 mL/min (ref >60)
Glucose, Bld: 96 mg/dL (ref 70–99)
Potassium: 3.6 mmol/L (ref 3.5–5.1)
Sodium: 140 mmol/L (ref 135–145)
Total Bilirubin: 1.1 mg/dL (ref 0.0–1.2)
Total Protein: 7.5 g/dL (ref 6.5–8.1)

## 2023-07-06 LAB — LIPASE, BLOOD: Lipase: 38 U/L (ref 11–51)

## 2023-07-06 MED ORDER — KETOROLAC TROMETHAMINE 30 MG/ML IJ SOLN
30.0000 mg | Freq: Once | INTRAMUSCULAR | Status: AC
  Administered 2023-07-06: 30 mg via INTRAVENOUS
  Filled 2023-07-06: qty 1

## 2023-07-06 MED ORDER — TAMSULOSIN HCL 0.4 MG PO CAPS: 0.4000 mg | ORAL_CAPSULE | Freq: Every day | ORAL | 0 refills | Status: DC

## 2023-07-06 MED ORDER — OXYCODONE-ACETAMINOPHEN 5-325 MG PO TABS: 2.0000 | ORAL_TABLET | Freq: Three times a day (TID) | ORAL | 0 refills | Status: DC | PRN

## 2023-07-06 MED ORDER — SODIUM CHLORIDE 0.9 % IV BOLUS (SEPSIS)
1000.0000 mL | Freq: Once | INTRAVENOUS | Status: AC
  Administered 2023-07-06: 1000 mL via INTRAVENOUS

## 2023-07-06 MED ORDER — MORPHINE SULFATE (PF) 4 MG/ML IV SOLN
4.0000 mg | Freq: Once | INTRAVENOUS | Status: AC
  Administered 2023-07-06: 4 mg via INTRAVENOUS
  Filled 2023-07-06: qty 1

## 2023-07-06 MED ORDER — IBUPROFEN 800 MG PO TABS: 800.0000 mg | ORAL_TABLET | Freq: Three times a day (TID) | ORAL | 0 refills | Status: DC | PRN

## 2023-07-06 MED ORDER — ONDANSETRON 4 MG PO TBDP: 4.0000 mg | ORAL_TABLET | Freq: Four times a day (QID) | ORAL | 0 refills | Status: DC | PRN

## 2023-07-06 MED ORDER — ONDANSETRON HCL 4 MG/2ML IJ SOLN
4.0000 mg | Freq: Once | INTRAMUSCULAR | Status: AC
  Administered 2023-07-06: 4 mg via INTRAVENOUS
  Filled 2023-07-06: qty 2

## 2023-07-06 NOTE — ED Notes (Signed)
 Discharged by night RN.

## 2023-07-06 NOTE — ED Provider Notes (Signed)
 [facility] Provider Note    Event Date/Time   First MD Initiated Contact with Patient 07/06/23 [phone]     (approximate)   History   Flank Pain   HPI  Shaun Holder is a 43 y.o. male with history of prior kidney stones who presents to the emergency department sudden onset left flank pain that woke him from sleep with nausea.  No dysuria or hematuria.  Feels similar to his prior kidney stones.  No fever.   History provided by patient.    Past Medical History:  Diagnosis Date   History of kidney stones    03/ 2018  PASSED SPONTANEOUSLY   Phimosis    Wears contact lenses     Past Surgical History:  Procedure Laterality Date   CIRCUMCISION N/A 02/13/2017   Procedure: CIRCUMCISION ADULT;  Surgeon: Malen Gauze, MD;  Location: [facility];  Service: Urology;  Laterality: N/A;   VASECTOMY Bilateral 02/13/2017   Procedure: VASECTOMY;  Surgeon: Malen Gauze, MD;  Location[facility];  Service: Urology;  Laterality: Bilateral;   WISDOM TOOTH EXTRACTION  age 45    MEDICATIONS:  Prior to Admission medications   Medication Sig Start Date End Date Taking? Authorizing Provider  cyclobenzaprine (FLEXERIL) 10 MG tablet Take 1 tablet (10 mg total) by mouth 3 (three) times daily as needed. 03/13/19   Joni Reining, PA-C  ketorolac (TORADOL) 10 MG tablet Take 1 tablet (10 mg total) by mouth every 6 (six) hours as needed. 03/13/19   Joni Reining, PA-C    Physical Exam   Triage Vital Signs: ED Triage Vitals  Encounter Vitals Group     BP 07/06/23 0456 (!) 161/109     Systolic BP Percentile --      Diastolic BP Percentile --      Pulse Rate 07/06/23 0456 92     Resp 07/06/23 0456 16     Temp 07/06/23 0456 98.6 F (37 C)     Temp src --      SpO2 07/06/23 0456 100 %     Weight --      Height --      Head Circumference --      Peak Flow --      Pain Score 07/06/23 0458 4     Pain Loc --      Pain  Education --      Exclude from Growth Chart --     Most recent vital signs: Vitals:   07/06/23 0632 07/06/23 0636  BP:    Pulse: 73   Resp:  16  Temp:    SpO2: 98%     CONSTITUTIONAL: Alert, responds appropriately to questions. Well-appearing; well-nourished HEAD: Normocephalic, atraumatic EYES: Conjunctivae clear, pupils appear equal, sclera nonicteric ENT: normal nose; moist mucous membranes NECK: Supple, normal ROM CARD: RRR; S1 and S2 appreciated RESP: Normal chest excursion without splinting or tachypnea; breath sounds clear and equal bilaterally; no wheezes, no rhonchi, no rales, no hypoxia or respiratory distress, speaking full sentences ABD/GI: Non-distended; soft, non-tender, no rebound, no guarding, no peritoneal signs BACK: The back appears normal EXT: Normal ROM in all joints; no deformity noted, no edema SKIN: Normal color for age and race; warm; no rash on exposed skin NEURO: Moves all extremities equally, normal speech PSYCH: The patient's mood and manner are appropriate.   ED Results / Procedures / Treatments   LABS: (all labs ordered are listed, but only abnormal  results are displayed) Labs Reviewed  COMPREHENSIVE METABOLIC PANEL WITH GFR - Abnormal; Notable for the following components:      Result Value   BUN 21 (*)    ALT 54 (*)    All other components within normal limits  URINALYSIS, ROUTINE W REFLEX MICROSCOPIC - Abnormal; Notable for the following components:   Color, Urine YELLOW (*)    APPearance CLEAR (*)    All other components within normal limits  LIPASE, BLOOD  CBC     EKG:   RADIOLOGY: My personal review and interpretation of imaging: CT scan shows ureterolithiasis.  I have personally reviewed all radiology reports.   CT Renal Stone Study Result Date: 07/06/2023 CLINICAL DATA:  43 year old male with abdomen and left flank pain, woke him suddenly. Prior kidney stones. EXAM: CT ABDOMEN AND PELVIS WITHOUT CONTRAST TECHNIQUE:  Multidetector CT imaging of the abdomen and pelvis was performed following the standard protocol without IV contrast. RADIATION DOSE REDUCTION: This exam was performed according to the departmental dose-optimization program which includes automated exposure control, adjustment of the mA and/or kV according to patient size and/or use of iterative reconstruction technique. COMPARISON:  CT Abdomen and Pelvis 10/20/2018. FINDINGS: Lower chest: 4 mm superior segment right lower lobe lung nodule on series 4, image 9. Questionable presence of this on 2018 comparison. And per Fleischner Society Guidelines, no routine follow-up imaging is recommended. Reference: Radiology. 2017; 284(1):228-43. Otherwise negative. Hepatobiliary: Stable noncontrast liver, small chronic hypodense area in the right liver dome with simple fluid density has been present since 2018, compatible with benign cyst or hemangioma (no follow-up imaging recommended). Negative gallbladder. Pancreas: Negative. Spleen: Negative. Adrenals/Urinary Tract: Normal adrenal glands. Right nephrolithiasis measuring up to 10 mm at the upper pole. Decompressed right renal collecting system and ureter. Left nephrolithiasis measuring up to 6 or 7 mm (upper pole). Minimal to mild left hydronephrosis. Asymmetric left hydroureter which continues into the pelvis. Distal left ureter 5 mm obstructing calculus on coronal image 64, series 2, image 91. This is located within 1.5 cm of the left ureterovesical junction. Bladder is decompressed. Stomach/Bowel: Negative. Normal appendix on series 2, image 77. No pneumoperitoneum, free fluid. Vascular/Lymphatic: Negative noncontrast appearance. Reproductive: Negative. Other: No pelvis free fluid. 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 No acute osseous abnormality identified. IMPRESSION: 1. Positive for a 5 mm distal left ureteral calculus with mild obstructive uropathy. Stone is within 1.5 cm of the left  UVJ. 2. Extensive underlying bilateral nephrolithiasis. Other calculi measuring up to 10 mm in the right kidney, 7 mm on the left. Electronically Signed   By: Odessa Fleming M.D.   On: 07/06/2023 06:26     PROCEDURES:  Critical Care performed: No     Procedures    IMPRESSION / MDM / ASSESSMENT AND PLAN / ED COURSE  I reviewed the triage vital signs and the nursing notes.    Patient here with left-sided flank pain, nausea.  History of kidney stones.     DIFFERENTIAL DIAGNOSIS (includes but not limited to):   Kidney stone, UTI, pyelonephritis, doubt appendicitis   Patient's presentation is most consistent with acute presentation with potential threat to life or bodily function.   PLAN: Will obtain labs, urine, CT renal study.  Will give IV fluids, pain and nausea medicine.  He states he can have someone drive him home.   MEDICATIONS GIVEN IN ED: Medications  morphine (PF) 4 MG/ML injection 4 mg (4 mg Intravenous Given 07/06/23 0531)  ondansetron (ZOFRAN)  injection 4 mg (4 mg Intravenous Given 07/06/23 0530)  ketorolac (TORADOL) 30 MG/ML injection 30 mg (30 mg Intravenous Given 07/06/23 0530)  sodium chloride 0.9 % bolus 1,000 mL (0 mLs Intravenous Stopped 07/06/23 0625)     ED COURSE: Labs show no leukocytosis, normal creatinine.  Urine shows no sign of infection.  CT scan shows 5 mm distal left ureteral stone with mild hydronephrosis.  Pain is currently well-controlled.  Patient comfortable plan for discharge home with pain medication, nausea medication, Flomax and urology follow-up.  Discussed return precautions.  Patient states his wife can drive him home.   At this time, I do not feel there is any life-threatening condition present. I reviewed all nursing notes, vitals, pertinent previous records.  All lab and urine results, EKGs, imaging ordered have been independently reviewed and interpreted by myself.  I reviewed all available radiology reports from any imaging ordered this  visit.  Based on my assessment, I feel the patient is safe to be discharged home without further emergent workup and can continue workup as an outpatient as needed. Discussed all findings, treatment plan as well as usual and customary return precautions.  They verbalize understanding and are comfortable with this plan.  Outpatient follow-up has been provided as needed.  All questions have been answered.    CONSULTS:  none   OUTSIDE RECORDS REVIEWED: Reviewed patient's previous admission for kidney stones.       FINAL CLINICAL IMPRESSION(S) / ED DIAGNOSES   Final diagnoses:  Kidney stone     Rx / DC Orders   ED Discharge Orders          Ordered    oxyCODONE-acetaminophen (PERCOCET) 5-325 MG tablet  Every 8 hours PRN        07/06/23 0648    ibuprofen (ADVIL) 800 MG tablet  Every 8 hours PRN        07/06/23 0648    ondansetron (ZOFRAN-ODT) 4 MG disintegrating tablet  Every 6 hours PRN        07/06/23 0648    tamsulosin (FLOMAX) 0.4 MG CAPS capsule  Daily        07/06/23 0648             Note:  This document was prepared using Dragon voice recognition software and may include unintentional dictation errors.   Annabelle Rexroad, Layla Maw, DO 07/06/23 [phone]

## 2023-07-06 NOTE — ED Triage Notes (Signed)
 Pt to ED by POV from home with c/o L sided flank pain which woke him up suddenly and hour ago. Pt has a history of kidney stones and states this is consistent with previous stones. Arrives A+O, VSS, NADN.

## 2023-07-06 NOTE — Discharge Instructions (Signed)

## 2023-07-23 ENCOUNTER — Encounter: Payer: Self-pay | Admitting: Emergency Medicine

## 2023-07-23 ENCOUNTER — Emergency Department
Admission: EM | Admit: 2023-07-23 | Discharge: 2023-07-23 | Disposition: A | Discharge status: Home / Self Care | Attending: Emergency Medicine | Admitting: Emergency Medicine

## 2023-07-23 ENCOUNTER — Other Ambulatory Visit: Payer: Self-pay

## 2023-07-23 ENCOUNTER — Emergency Department

## 2023-07-23 DIAGNOSIS — N202 Calculus of kidney with calculus of ureter: Secondary | ICD-10-CM | POA: Insufficient documentation

## 2023-07-23 DIAGNOSIS — R1032 Left lower quadrant pain: Secondary | ICD-10-CM | POA: Diagnosis present

## 2023-07-23 DIAGNOSIS — N200 Calculus of kidney: Secondary | ICD-10-CM

## 2023-07-23 DIAGNOSIS — D72829 Elevated white blood cell count, unspecified: Secondary | ICD-10-CM | POA: Insufficient documentation

## 2023-07-23 LAB — BASIC METABOLIC PANEL WITH GFR
Anion gap: 8 (ref 5–15)
BUN: 21 mg/dL — ABNORMAL HIGH (ref 6–20)
CO2: 25 mmol/L (ref 22–32)
Calcium: 9.4 mg/dL (ref 8.9–10.3)
Chloride: 102 mmol/L (ref 98–111)
Creatinine, Ser: 2.03 mg/dL — ABNORMAL HIGH (ref 0.61–1.24)
GFR, Estimated: 42 mL/min — ABNORMAL LOW (ref >60)
Glucose, Bld: 105 mg/dL — ABNORMAL HIGH (ref 70–99)
Potassium: 4.2 mmol/L (ref 3.5–5.1)
Sodium: 135 mmol/L (ref 135–145)

## 2023-07-23 LAB — URINALYSIS, ROUTINE W REFLEX MICROSCOPIC
Bilirubin Urine: NEGATIVE
Glucose, UA: NEGATIVE mg/dL
Hgb urine dipstick: NEGATIVE
Ketones, ur: NEGATIVE mg/dL
Leukocytes,Ua: NEGATIVE
Nitrite: NEGATIVE
Protein, ur: NEGATIVE mg/dL
Specific Gravity, Urine: 1.011 (ref 1.005–1.030)
pH: 7 (ref 5.0–8.0)

## 2023-07-23 LAB — CBC
HCT: 42 % (ref 39.0–52.0)
Hemoglobin: 14.5 g/dL (ref 13.0–17.0)
MCH: 30.3 pg (ref 26.0–34.0)
MCHC: 34.5 g/dL (ref 30.0–36.0)
MCV: 87.9 fL (ref 80.0–100.0)
Platelets: 263 10*3/uL (ref 150–400)
RBC: 4.78 MIL/uL (ref 4.22–5.81)
RDW: 11.9 % (ref 11.5–15.5)
WBC: 10.7 10*3/uL — ABNORMAL HIGH (ref 4.0–10.5)
nRBC: 0 % (ref 0.0–0.2)

## 2023-07-23 MED ORDER — NAPROXEN 500 MG PO TABS: 500.0000 mg | ORAL_TABLET | Freq: Two times a day (BID) | ORAL | 2 refills | Status: AC

## 2023-07-23 MED ORDER — OXYCODONE-ACETAMINOPHEN 5-325 MG PO TABS: 1.0000 | ORAL_TABLET | Freq: Three times a day (TID) | ORAL | 0 refills | Status: AC | PRN

## 2023-07-23 MED ORDER — ONDANSETRON HCL 4 MG/2ML IJ SOLN
4.0000 mg | Freq: Once | INTRAMUSCULAR | Status: AC
  Administered 2023-07-23: 4 mg via INTRAVENOUS
  Filled 2023-07-23: qty 2

## 2023-07-23 MED ORDER — SODIUM CHLORIDE 0.9 % IV BOLUS
500.0000 mL | Freq: Once | INTRAVENOUS | Status: AC
  Administered 2023-07-23: 500 mL via INTRAVENOUS

## 2023-07-23 MED ORDER — KETOROLAC TROMETHAMINE 30 MG/ML IJ SOLN
30.0000 mg | Freq: Once | INTRAMUSCULAR | Status: AC
  Administered 2023-07-23: 30 mg via INTRAVENOUS
  Filled 2023-07-23: qty 1

## 2023-07-23 MED ORDER — TAMSULOSIN HCL 0.4 MG PO CAPS: 0.4000 mg | ORAL_CAPSULE | Freq: Every day | ORAL | 0 refills | Status: AC

## 2023-07-23 MED ORDER — ONDANSETRON 4 MG PO TBDP: 4.0000 mg | ORAL_TABLET | Freq: Three times a day (TID) | ORAL | 0 refills | Status: AC | PRN

## 2023-07-23 NOTE — ED Triage Notes (Signed)
 Patient to ED via POV for left sided flank pain since Friday. Hx of kidney stones. Denies difficulty urinating but some difficulty having a BM. Did use an enema this AM that gave some relief.

## 2023-07-23 NOTE — ED Provider Notes (Addendum)
 [facility] Provider Note    Event Date/Time   First MD Initiated Contact with Patient 07/23/23 [phone]     (approximate)   History   Flank Pain   HPI  Shaun Holder is a 43 y.o. male with a history of kidney stones who presents with complaints of left-sided flank pain for the last 4 days.  Review of records demonstrates patient was seen on April 6 had left-sided ureteral stone, that scan also demonstrated extensive underlying bilateral nephrolithiasis.  No fever, no dysuria     Physical Exam   Triage Vital Signs: ED Triage Vitals [07/23/23 0927]  Encounter Vitals Group     BP (!) 155/108     Systolic BP Percentile      Diastolic BP Percentile      Pulse Rate 94     Resp 17     Temp 98.1 F (36.7 C)     Temp Source Oral     SpO2 100 %     Weight 104.3 kg (230 lb)     Height 1.778 m (5\' 10" )     Head Circumference      Peak Flow      Pain Score 3     Pain Loc      Pain Education      Exclude from Growth Chart     Most recent vital signs: Vitals:   07/23/23 0927 07/23/23 1200  BP: (!) 155/108 132/89  Pulse: 94 81  Resp: 17 18  Temp: 98.1 F (36.7 C)   SpO2: 100% 99%     General: Awake, no distress.  CV:  Good peripheral perfusion.  Resp:  Normal effort.  Abd:  No distention.  Mild left CVA tenderness Other:     ED Results / Procedures / Treatments   Labs (all labs ordered are listed, but only abnormal results are displayed) Labs Reviewed  URINALYSIS, ROUTINE W REFLEX MICROSCOPIC - Abnormal; Notable for the following components:      Result Value   Color, Urine YELLOW (*)    APPearance CLEAR (*)    All other components within normal limits  BASIC METABOLIC PANEL WITH GFR - Abnormal; Notable for the following components:   Glucose, Bld 105 (*)    BUN 21 (*)    Creatinine, Ser 2.03 (*)    GFR, Estimated 42 (*)    All other components within normal limits  CBC - Abnormal; Notable for the following components:   WBC 10.7  (*)    All other components within normal limits     EKG     RADIOLOGY KUB viewed interpret by me, stone noted on the left    PROCEDURES:  Critical Care performed:   Procedures   MEDICATIONS ORDERED IN ED: Medications  ketorolac  (TORADOL ) 30 MG/ML injection 30 mg (30 mg Intravenous Given 07/23/23 0952)  sodium chloride  0.9 % bolus 500 mL (0 mLs Intravenous Stopped 07/23/23 1058)  ondansetron  (ZOFRAN ) injection 4 mg (4 mg Intravenous Given 07/23/23 0952)  sodium chloride  0.9 % bolus 500 mL (500 mLs Intravenous New Bag/Given 07/23/23 1057)     IMPRESSION / MDM / ASSESSMENT AND PLAN / ED COURSE  I reviewed the triage vital signs and the nursing notes. Patient's presentation is most consistent with severe exacerbation of chronic illness.  Patient presents with flank pain as detailed above, highly suspicious for ureterolithiasis given his history of known kidney stones.  Differential also also includes UTI/pyelonephritis, diverticulitis  Will treat with  Toradol , IV fluids, IV Zofran , obtain labs and reevaluate.  Lab work is reassuring, urinalysis without evidence of infection  KUB demonstrates 5 mm ureteral stone this is likely the cause of his pain.  Patient is feeling much better after treatment, appropriate for discharge with outpatient follow-up, he knows to return if any change      FINAL CLINICAL IMPRESSION(S) / ED DIAGNOSES   Final diagnoses:  Kidney stone     Rx / DC Orders   ED Discharge Orders          Ordered    naproxen  (NAPROSYN ) 500 MG tablet  2 times daily with meals        07/23/23 1229    oxyCODONE -acetaminophen  (PERCOCET) 5-325 MG tablet  Every 8 hours PRN        07/23/23 1229    ondansetron  (ZOFRAN -ODT) 4 MG disintegrating tablet  Every 8 hours PRN        07/23/23 1229    tamsulosin  (FLOMAX ) 0.4 MG CAPS capsule  Daily        07/23/23 1229             Note:  This document was prepared using Dragon voice recognition software and may  include unintentional dictation errors.   Bryson Carbine, MD 07/23/23 1240    Bryson Carbine, MD 07/23/23 1240

## 2023-08-06 ENCOUNTER — Other Ambulatory Visit: Payer: Self-pay | Admitting: Urology

## 2023-09-17 ENCOUNTER — Ambulatory Visit (HOSPITAL_COMMUNITY): Admit: 2023-09-17 | Admitting: Urology

## 2023-09-17 SURGERY — CYSTOSCOPY/URETEROSCOPY/HOLMIUM LASER/STENT PLACEMENT
Anesthesia: General | Laterality: Bilateral
